# Patient Record
Sex: Male | Born: 1975 | Race: White | Hispanic: No | Marital: Married | Smoking: Never smoker
Health system: Southern US, Community
[De-identification: ages and names within clinical notes are randomized; demographics above are authoritative.]

## PROBLEM LIST (undated history)

## (undated) DIAGNOSIS — K5792 Diverticulitis of intestine, part unspecified, without perforation or abscess without bleeding: Secondary | ICD-10-CM

## (undated) DIAGNOSIS — Z87442 Personal history of urinary calculi: Secondary | ICD-10-CM

## (undated) DIAGNOSIS — Z789 Other specified health status: Secondary | ICD-10-CM

## (undated) DIAGNOSIS — Z83719 Family history of colon polyps, unspecified: Secondary | ICD-10-CM

## (undated) HISTORY — PX: COLON SURGERY: SHX602

## (undated) HISTORY — PX: OTHER SURGICAL HISTORY: SHX169

## (undated) HISTORY — DX: Diverticulitis of intestine, part unspecified, without perforation or abscess without bleeding: K57.92

## (undated) HISTORY — PX: WISDOM TOOTH EXTRACTION: SHX21

## (undated) HISTORY — PX: VASECTOMY: SHX75

## (undated) HISTORY — PX: KNEE ARTHROSCOPY: SUR90

## (undated) HISTORY — DX: Family history of colon polyps, unspecified: Z83.719

## (undated) HISTORY — DX: Personal history of urinary calculi: Z87.442

---
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
## 1999-08-03 ENCOUNTER — Encounter: Admission: RE | Admit: 1999-08-03 | Discharge: 1999-08-03 | Payer: Self-pay | Admitting: *Deleted

## 1999-08-03 ENCOUNTER — Encounter: Payer: Self-pay | Admitting: *Deleted

## 2008-07-02 HISTORY — PX: HARDWARE REMOVAL: SHX979

## 2008-07-02 HISTORY — PX: OTHER SURGICAL HISTORY: SHX169

## 2009-03-13 ENCOUNTER — Inpatient Hospital Stay: Payer: Self-pay | Admitting: Unknown Physician Specialty

## 2009-07-02 HISTORY — PX: HARDWARE REMOVAL: SHX979

## 2009-12-09 ENCOUNTER — Ambulatory Visit: Payer: Self-pay | Admitting: Unknown Physician Specialty

## 2011-07-03 HISTORY — PX: MEDIAL PARTIAL KNEE REPLACEMENT: SHX5965

## 2013-07-02 HISTORY — PX: KNEE ARTHROSCOPY: SUR90

## 2015-01-13 ENCOUNTER — Encounter: Payer: Self-pay | Admitting: Podiatry

## 2015-01-13 ENCOUNTER — Ambulatory Visit (INDEPENDENT_AMBULATORY_CARE_PROVIDER_SITE_OTHER): Payer: BLUE CROSS/BLUE SHIELD | Admitting: Podiatry

## 2015-01-13 ENCOUNTER — Ambulatory Visit (INDEPENDENT_AMBULATORY_CARE_PROVIDER_SITE_OTHER): Payer: BLUE CROSS/BLUE SHIELD

## 2015-01-13 VITALS — BP 116/86 | HR 74 | Resp 16 | Ht 72.0 in | Wt 210.0 lb

## 2015-01-13 DIAGNOSIS — M79672 Pain in left foot: Secondary | ICD-10-CM

## 2015-01-13 DIAGNOSIS — M779 Enthesopathy, unspecified: Secondary | ICD-10-CM

## 2015-01-13 MED ORDER — MELOXICAM 15 MG PO TABS: 15.0000 mg | ORAL_TABLET | Freq: Every day | ORAL | Status: DC

## 2015-01-13 NOTE — Patient Instructions (Signed)
Peroneal Tendinitis with Rehab Tendonitis is inflammation of a tendon. Inflammation of the tendons on the back of the outer ankle (peroneal tendons) is known as peroneal tendonitis. The peroneal tendons are responsible for connecting the muscles that allow you to stand on your tiptoes to the bones of the ankle. For this reason, peroneal tendonitis often causes pain when trying to complete such motions. Peroneal tendonitis often involves a tear (strain) of the peroneal tendons. Strains are classified into three categories. Grade 1 strains cause pain, but the tendon is not lengthened. Grade 2 strains include a lengthened ligament, due to the ligament being stretched or partially ruptured. With grade 2 strains there is still function, although function may be decreased. Grade 3 strains involve a complete tear of the tendon or muscle, and function is usually impaired. SYMPTOMS   Pain, tenderness, swelling, warmth, or redness over the back of the outer side of the ankle, the outer part of the mid-foot, or the bottom of the arch.  Pain that gets worse with ankle motion (especially when pushing off or pushing down with the front of the foot), or when standing on the ball of the foot or pushing the foot outward.  Crackling sound (crepitation) when the tendon is moved or touched. CAUSES  Peroneal tendinitis occurs when injury to the peroneal tendons causes the body to respond with inflammation. Common causes of injury include:  An overuse injury, in which the groove behind the outer ankle (where the tendon is located) causes wear on the tendon.  A sudden stress placed on the tendon, such as from an increase in the intensity, frequency, or duration of training.  Direct hit (trauma) to the tendon.  Return to activity too soon after a previous ankle injury. RISK INCREASES WITH:  Sports that require sudden, repetitive pushing off of the foot, such as jumping or quick starts.  Kicking and running sports,  especially running down hills or long distances.  Poor strength and flexibility.  Previous injury to the foot, ankle, or leg. PREVENTION  Warm up and stretch properly before activity.  Allow for adequate recovery between workouts.  Maintain physical fitness:  Strength, flexibility, and endurance.  Cardiovascular fitness.  Complete rehabilitation after previous injury. PROGNOSIS  If treated properly, peroneal tendonitis usually heals within 6 weeks.  RELATED COMPLICATIONS  Longer healing time, if not properly treated or if not given enough time to heal.  Recurring symptoms if activity is resumed too soon, with overuse, or when using poor technique.  If untreated, tendinitis may result in tendon rupture, requiring surgery. TREATMENT  Treatment first involves the use of ice and medicine to reduce pain and inflammation. The use of strengthening and stretching exercises may help reduce pain with activity. These exercises may be performed at home or with a therapist. Sometimes, the foot and ankle will be restrained for 10 to 14 days to promote healing. Your caregiver may advise that you place a heel lift in your shoes to reduce the stress placed on the tendon. If nonsurgical treatment is unsuccessful, surgery to remove the inflamed tendon lining (sheath) may be advised.  MEDICATION   If pain medicine is needed, nonsteroidal anti-inflammatory medicines (aspirin and ibuprofen), or other minor pain relievers (acetaminophen), are often advised.  Do not take pain medicine for 7 days before surgery.  Prescription pain relievers may be given, if your caregiver thinks they are needed. Use only as directed and only as much as you need. HEAT AND COLD  Cold treatment (icing) should   be applied for 10 to 15 minutes every 2 to 3 hours for inflammation and pain, and immediately after activity that aggravates your symptoms. Use ice packs or an ice massage.  Heat treatment may be used before  performing stretching and strengthening activities prescribed by your caregiver, physical therapist, or athletic trainer. Use a heat pack or a warm water soak. SEEK MEDICAL CARE IF:  Symptoms get worse or do not improve in 2 to 4 weeks, despite treatment.  New, unexplained symptoms develop. (Drugs used in treatment may produce side effects.) EXERCISES RANGE OF MOTION (ROM) AND STRETCHING EXERCISES - Peroneal Tendinitis These exercises may help you when beginning to rehabilitate your injury. Your symptoms may resolve with or without further involvement from your physician, physical therapist or athletic trainer. While completing these exercises, remember:   Restoring tissue flexibility helps normal motion to return to the joints. This allows healthier, less painful movement and activity.  An effective stretch should be held for at least 30 seconds.  A stretch should never be painful. You should only feel a gentle lengthening or release in the stretched tissue. RANGE OF MOTION - Ankle Eversion  Sit with your right / left ankle crossed over your opposite knee.  Grip your foot with your opposite hand, placing your thumb on the top of your foot and your fingers across the bottom of your foot.  Gently push your foot downward with a slight rotation, so your littlest toes rise slightly toward the ceiling.  You should feel a gentle stretch on the inside of your ankle. Hold the stretch for __________ seconds. Repeat __________ times. Complete this exercise __________ times per day.  RANGE OF MOTION - Ankle Inversion  Sit with your right / left ankle crossed over your opposite knee.  Grip your foot with your opposite hand, placing your thumb on the bottom of your foot and your fingers across the top of your foot.  Gently pull your foot so the smallest toe comes toward you and your thumb pushes the inside of the ball of your foot away from you.  You should feel a gentle stretch on the outside of  your ankle. Hold the stretch for __________ seconds. Repeat __________ times. Complete this exercise __________ times per day.  RANGE OF MOTION - Ankle Plantar Flexion  Sit with your right / left leg crossed over your opposite knee.  Use your opposite hand to pull the top of your foot and toes toward you.  You should feel a gentle stretch on the top of your foot and ankle. Hold this position for __________ seconds. Repeat __________ times. Complete __________ times per day.  STRETCH - Gastroc, Standing  Place your hands on a wall.  Extend your right / left leg behind you, keeping the front knee somewhat bent.  Slightly point your toes inward on your back foot.  Keeping your right / left heel on the floor and your knee straight, shift your weight toward the wall, not allowing your back to arch.  You should feel a gentle stretch in the calf. Hold this position for __________ seconds. Repeat __________ times. Complete this stretch __________ times per day. STRETCH - Soleus, Standing  Place your hands on a wall.  Extend your right / left leg behind you, keeping the other knee somewhat bent.  Slightly point your toes inward on your back foot.  Keep your heel on the floor, bend your back knee, and slightly shift your weight over the back leg so that   you feel a gentle stretch deep in your back calf.  Hold this position for __________ seconds. Repeat __________ times. Complete this stretch __________ times per day. STRETCH - Gastrocsoleus, Standing Note: This exercise can place a lot of stress on your foot and ankle. Please complete this exercise only if specifically instructed by your caregiver.   Place the ball of your right / left foot on a step, keeping your other foot firmly on the same step.  Hold on to the wall or a rail for balance.  Slowly lift your other foot, allowing your body weight to press your heel down over the edge of the step.  You should feel a stretch in your  right / left calf.  Hold this position for __________ seconds.  Repeat this exercise with a slight bend in your knee. Repeat __________ times. Complete this stretch __________ times per day.  STRENGTHENING EXERCISES - Peroneal Tendinitis  These exercises may help you when beginning to rehabilitate your injury. They may resolve your symptoms with or without further involvement from your physician, physical therapist or athletic trainer. While completing these exercises, remember:   Muscles can gain both the endurance and the strength needed for everyday activities through controlled exercises.  Complete these exercises as instructed by your physician, physical therapist or athletic trainer. Increase the resistance and repetitions only as guided by your caregiver. STRENGTH - Dorsiflexors  Secure a rubber exercise band or tubing to a fixed object (table, pole) and loop the other end around your right / left foot.  Sit on the floor facing the fixed object. The band should be slightly tense when your foot is relaxed.  Slowly draw your foot back toward you, using your ankle and toes.  Hold this position for __________ seconds. Slowly release the tension in the band and return your foot to the starting position. Repeat __________ times. Complete this exercise __________ times per day.  STRENGTH - Towel Curls  Sit in a chair, on a non-carpeted surface.  Place your foot on a towel, keeping your heel on the floor.  Pull the towel toward your heel only by curling your toes. Keep your heel on the floor.  If instructed by your physician, physical therapist or athletic trainer, add weight to the end of the towel. Repeat __________ times. Complete this exercise __________ times per day. STRENGTH - Ankle Eversion   Secure one end of a rubber exercise band or tubing to a fixed object (table, pole). Loop the other end around your foot, just before your toes.  Place your fists between your knees.  This will focus your strengthening at your ankle.  Drawing the band across your opposite foot, away from the pole, slowly, pull your little toe out and up. Make sure the band is positioned to resist the entire motion.  Hold this position for __________ seconds.  Have your muscles resist the band, as it slowly pulls your foot back to the starting position. Repeat __________ times. Complete this exercise __________ times per day.  Document Released: 06/18/2005 Document Revised: 11/02/2013 Document Reviewed: 09/30/2008 [employer] Patient Information 2015 [employer]. This information is not intended to replace advice given to you by your health care provider. Make sure you discuss any questions you have with your health care provider.  

## 2015-01-13 NOTE — Progress Notes (Signed)
   Subjective:    Patient ID: Jeremy Owens, male    DOB: Mar 03, 1976, 39 y.o.   MRN: [id number]  HPI 39 year old male presents the office they with concerns of pain to the outside aspect of his left foot. He states he has had pain to the area over the last several weeks. He states the couple weeks ago the pain was much more significant and it has pretty much resolved at this point. All the pain has been much resolved to decide to have it checked out. He denies any history of injury or trauma to the area. He states that a week after the pain started to the beach and walking on the sand mated area much worse. He said no prior treatment. No other complaints at this time.   Review of Systems  All other systems reviewed and are negative.      Objective:   Physical Exam AAO x3, NAD DP/PT pulses palpable bilaterally, CRT less than 3 seconds Protective sensation intact with Simms Weinstein monofilament, vibratory sensation intact, Achilles tendon reflex intact There is mild tenderness to palpation along the course of the peroneal tendons inferior to the lateral malleolus and just proximal to the fifth metatarsal base. There is mild discomfort with eversion. Peroneal tendons appear to be intact. There is also mild discomfort on the plantar lateral aspect of the heel just distal to the tuberosity of the calcaneus. There is no pain with lateral compression of the calcaneus and there is no pain with vibratory sensation. No pain along the posterior aspect of the Achilles tendon or along the insertion.  No areas of tenderness to bilateral lower extremities. MMT 5/5, ROM WNL.  No open lesions or pre-ulcerative lesions.  No overlying edema, erythema, increase in warmth to bilateral lower extremities.  No pain with calf compression, swelling, warmth, erythema bilaterally.      Assessment & Plan:  39 year old male with resolving left foot pain, likely peroneal tendinitis -X-rays were obtained and reviewed  with the patient.  -Treatment options discussed including all alternatives, risks, and complications -Discussed likely etiology of his symptoms. -Prescribed mobic. Discussed side effects of the medication and directed to stop if any are to occur and call the office.  -Discussed stretching, rehabilitation exercises for peroneal tendinitis. -Follow-up in 4 weeks if symptoms are not resolved or sooner if any problems arise. In the meantime, encouraged to call the office with any questions, concerns, change in symptoms.   Ovid CurdMatthew Marva Hendryx, DPM

## 2015-01-14 ENCOUNTER — Encounter: Payer: Self-pay | Admitting: Podiatry

## 2015-03-08 ENCOUNTER — Ambulatory Visit: Payer: BLUE CROSS/BLUE SHIELD | Admitting: Podiatry

## 2015-07-03 HISTORY — PX: KNEE ARTHROSCOPY: SHX127

## 2015-09-11 ENCOUNTER — Emergency Department (HOSPITAL_COMMUNITY): Payer: BLUE CROSS/BLUE SHIELD

## 2015-09-11 ENCOUNTER — Encounter (HOSPITAL_COMMUNITY): Payer: Self-pay | Admitting: Family Medicine

## 2015-09-11 ENCOUNTER — Emergency Department (HOSPITAL_COMMUNITY)
Admission: EM | Admit: 2015-09-11 | Discharge: 2015-09-11 | Disposition: A | Discharge status: Home / Self Care | Payer: BLUE CROSS/BLUE SHIELD | Attending: Emergency Medicine | Admitting: Emergency Medicine

## 2015-09-11 DIAGNOSIS — W208XXA Other cause of strike by thrown, projected or falling object, initial encounter: Secondary | ICD-10-CM | POA: Insufficient documentation

## 2015-09-11 DIAGNOSIS — S61219A Laceration without foreign body of unspecified finger without damage to nail, initial encounter: Secondary | ICD-10-CM

## 2015-09-11 DIAGNOSIS — Z23 Encounter for immunization: Secondary | ICD-10-CM | POA: Diagnosis not present

## 2015-09-11 DIAGNOSIS — S61011A Laceration without foreign body of right thumb without damage to nail, initial encounter: Secondary | ICD-10-CM | POA: Diagnosis not present

## 2015-09-11 DIAGNOSIS — Y9289 Other specified places as the place of occurrence of the external cause: Secondary | ICD-10-CM | POA: Insufficient documentation

## 2015-09-11 DIAGNOSIS — Y9389 Activity, other specified: Secondary | ICD-10-CM | POA: Diagnosis not present

## 2015-09-11 DIAGNOSIS — Z791 Long term (current) use of non-steroidal anti-inflammatories (NSAID): Secondary | ICD-10-CM | POA: Insufficient documentation

## 2015-09-11 DIAGNOSIS — Y998 Other external cause status: Secondary | ICD-10-CM | POA: Diagnosis not present

## 2015-09-11 DIAGNOSIS — S6991XA Unspecified injury of right wrist, hand and finger(s), initial encounter: Secondary | ICD-10-CM | POA: Diagnosis present

## 2015-09-11 MED ORDER — OXYCODONE-ACETAMINOPHEN 5-325 MG PO TABS
1.0000 | ORAL_TABLET | Freq: Once | ORAL | Status: AC
  Administered 2015-09-11: 1 via ORAL
  Filled 2015-09-11: qty 1

## 2015-09-11 MED ORDER — LIDOCAINE HCL (PF) 1 % IJ SOLN
5.0000 mL | Freq: Once | INTRAMUSCULAR | Status: AC
  Administered 2015-09-11: 5 mL
  Filled 2015-09-11: qty 5

## 2015-09-11 MED ORDER — TETANUS-DIPHTH-ACELL PERTUSSIS 5-2.5-18.5 LF-MCG/0.5 IM SUSP
0.5000 mL | Freq: Once | INTRAMUSCULAR | Status: AC
  Administered 2015-09-11: 0.5 mL via INTRAMUSCULAR
  Filled 2015-09-11: qty 0.5

## 2015-09-11 NOTE — ED Notes (Signed)
Pt here for laceration to right thumb with a table saw. Bleeding controlled. sts sensation in tact.

## 2015-09-11 NOTE — ED Notes (Signed)
Declined W/C at D/C and was escorted to lobby by RN. 

## 2015-09-11 NOTE — Discharge Instructions (Signed)
Laceration Care, Adult  A laceration is a cut that goes through all layers of the skin. The cut also goes into the tissue that is right under the skin. Some cuts heal on their own. Others need to be closed with stitches (sutures), staples, skin adhesive strips, or wound glue. Taking care of your cut lowers your risk of infection and helps your cut to heal better.  HOW TO TAKE CARE OF YOUR CUT  For stitches or staples:  · Keep the wound clean and dry.  · If you were given a bandage (dressing), you should change it at least one time per day or as told by your doctor. You should also change it if it gets wet or dirty.  · Keep the wound completely dry for the first 24 hours or as told by your doctor. After that time, you may take a shower or a bath. However, make sure that the wound is not soaked in water until after the stitches or staples have been removed.  · Clean the wound one time each day or as told by your doctor:    Wash the wound with soap and water.    Rinse the wound with water until all of the soap comes off.    Pat the wound dry with a clean towel. Do not rub the wound.  · After you clean the wound, put a thin layer of antibiotic ointment on it as told by your doctor. This ointment:    Helps to prevent infection.    Keeps the bandage from sticking to the wound.  · Have your stitches or staples removed as told by your doctor.  If your doctor used skin adhesive strips:   · Keep the wound clean and dry.  · If you were given a bandage, you should change it at least one time per day or as told by your doctor. You should also change it if it gets dirty or wet.  · Do not get the skin adhesive strips wet. You can take a shower or a bath, but be careful to keep the wound dry.  · If the wound gets wet, pat it dry with a clean towel. Do not rub the wound.  · Skin adhesive strips fall off on their own. You can trim the strips as the wound heals. Do not remove any strips that are still stuck to the wound. They will  fall off after a while.  If your doctor used wound glue:  · Try to keep your wound dry, but you may briefly wet it in the shower or bath. Do not soak the wound in water, such as by swimming.  · After you take a shower or a bath, gently pat the wound dry with a clean towel. Do not rub the wound.  · Do not do any activities that will make you really sweaty until the skin glue has fallen off on its own.  · Do not apply liquid, cream, or ointment medicine to your wound while the skin glue is still on.  · If you were given a bandage, you should change it at least one time per day or as told by your doctor. You should also change it if it gets dirty or wet.  · If a bandage is placed over the wound, do not let the tape for the bandage touch the skin glue.  · Do not pick at the glue. The skin glue usually stays on for 5-10 days. Then, it   or when wound glue stays in place and the wound is healed. Make sure to wear a sunscreen of at least 30 SPF.  Take over-the-counter and prescription medicines only as told by your doctor.  If you were given antibiotic medicine or ointment, take or apply it as told by your doctor. Do not stop using the antibiotic even if your wound is getting better.  Do not scratch or pick at the wound.  Keep all follow-up visits as told by your doctor. This is important.  Check your wound every day for signs of infection. Watch for:  Redness, swelling, or pain.  Fluid, blood, or pus.  Raise (elevate) the injured area above the level of your heart while you are sitting or lying down, if possible. GET HELP IF:  You got a tetanus shot and you have any of these problems at the injection site:  Swelling.  Very bad pain.  Redness.  Bleeding.  You have a fever.  A wound that was  closed breaks open.  You notice a bad smell coming from your wound or your bandage.  You notice something coming out of the wound, such as wood or glass.  Medicine does not help your pain.  You have more redness, swelling, or pain at the site of your wound.  You have fluid, blood, or pus coming from your wound.  You notice a change in the color of your skin near your wound.  You need to change the bandage often because fluid, blood, or pus is coming from the wound.  You start to have a new rash.  You start to have numbness around the wound. GET HELP RIGHT AWAY IF:  You have very bad swelling around the wound.  Your pain suddenly gets worse and is very bad.  You notice painful lumps near the wound or on skin that is anywhere on your body.  You have a red streak going away from your wound.  The wound is on your hand or foot and you cannot move a finger or toe like you usually can.  The wound is on your hand or foot and you notice that your fingers or toes look pale or bluish.   This information is not intended to replace advice given to you by your health care provider. Make sure you discuss any questions you have with your health care provider.  Follow-up with your PCP for suture removal in 7 days. Keep wound clean and dry. May wash as up and water. Take Profen as needed for pain. Return to the emergency department if you expands worsening of her symptoms, redness or swelling around your finger, fever, chills.

## 2015-09-11 NOTE — ED Provider Notes (Signed)
CSN: [id number]     Arrival date & time 09/11/15  1408 History   First MD Initiated Contact with Patient 09/11/15 1506     Chief Complaint  Patient presents with  . Finger Injury     (Consider location/radiation/quality/duration/timing/severity/associated sxs/prior Treatment) HPI  Jeremy Owens is a 40 y.o M With no significant past medical history presents the emergency department complaining of a laceration to his right thumb. Patient states that he was using a table saw earlier when it slipped and it cut the finger pad of his right thumb. Bleeding has subsided. Patient unknown of last tetanus. No numbness or tingling. No decreased range of motion of thumb.   History reviewed. No pertinent past medical history. History reviewed. No pertinent past surgical history. History reviewed. No pertinent family history. Social History  Substance Use Topics  . Smoking status: Never Smoker   . Smokeless tobacco: Current User    Types: Chew  . Alcohol Use: None    Review of Systems  All other systems reviewed and are negative.     Allergies  Sulfa antibiotics  Home Medications   Prior to Admission medications   Medication Sig Start Date End Date Taking? Authorizing Provider  meloxicam (MOBIC) 15 MG tablet Take 1 tablet (15 mg total) by mouth daily. 01/13/15   Vivi Barrack, DPM  traMADol (ULTRAM) 50 MG tablet Take by mouth every 6 (six) hours as needed. for pain 12/27/14   Historical Provider, MD   BP 117/69 mmHg  Pulse 64  Temp(Src) 98.3 F (36.8 C) (Oral)  Resp 18  SpO2 97% Physical Exam  Constitutional: He is oriented to person, place, and time. He appears well-developed and well-nourished. No distress.  HENT:  Head: Normocephalic and atraumatic.  Eyes: Conjunctivae are normal. Right eye exhibits no discharge. Left eye exhibits no discharge. No scleral icterus.  Cardiovascular: Normal rate.   Pulmonary/Chest: Effort normal.  Musculoskeletal:       Right hand: He  exhibits decreased range of motion and laceration. He exhibits no bony tenderness, normal capillary refill and no swelling. Normal sensation noted. Normal strength noted.       Hands: 1.5 cm laceration to finger pad of right thumb. No foreign body seen. No sensory deficits. No evidence of tendon injury. No decreased range of motion of thumb  Neurological: He is alert and oriented to person, place, and time. Coordination normal.  Skin: Skin is warm and dry. No rash noted. He is not diaphoretic. No erythema. No pallor.  Psychiatric: He has a normal mood and affect. His behavior is normal.  Nursing note and vitals reviewed.   ED Course  Procedures (including critical care time) LACERATION REPAIR Performed by: Dub Mikes Authorized by: Dub Mikes Consent: Verbal consent obtained. Risks and benefits: risks, benefits and alternatives were discussed Consent given by: patient Patient identity confirmed: provided demographic data Prepped and Draped in normal sterile fashion Wound explored  Laceration Location: Right thumb  Laceration Length: 1.5 cm  No Foreign Bodies seen or palpated  Anesthesia: local infiltration  Local anesthetic: lidocaine 1 % without epinephrine  Anesthetic total: 2.5 ml  Irrigation method: syringe Amount of cleaning: standard  Skin closure: Approximated   Number of sutures: 3   Technique: Simple interrupted   Patient tolerance: Patient tolerated the procedure well with no immediate complications.  Labs Review Labs Reviewed - No data to display  Imaging Review Dg Finger Thumb Right  09/11/2015  CLINICAL DATA:  Laceration to distal phalanx  on table saw. EXAM: RIGHT THUMB 2+V COMPARISON:  None. FINDINGS: There is no evidence of fracture or dislocation. There is no evidence of arthropathy or other focal bone abnormality. Soft tissues are unremarkable IMPRESSION: Negative. Electronically Signed   By: Charlett NoseKevin  Dover M.D.   On: 09/11/2015  15:43   I have personally reviewed and evaluated these images and lab results as part of my medical decision-making.   EKG Interpretation None      MDM   Final diagnoses:  Finger laceration, initial encounter    Tdap booster given.Pressure irrigation performed. Laceration occurred < 8 hours prior to repair which was well tolerated. Pt has no co morbidities to effect normal wound healing. X-ray negative for fracture. Discussed suture home care w pt and answered questions. Pt to f-u for wound check and suture removal in 7 days. Pt is hemodynamically stable w no complaints prior to dc.       Lester KinsmanSamantha Tripp [place], PA-C 09/11/15 1754  Doug SouSam Jacubowitz, MD 09/11/15 1815

## 2015-11-23 ENCOUNTER — Other Ambulatory Visit: Payer: Self-pay | Admitting: Orthopedic Surgery

## 2015-11-23 DIAGNOSIS — M25561 Pain in right knee: Secondary | ICD-10-CM

## 2015-12-03 ENCOUNTER — Ambulatory Visit
Admission: RE | Admit: 2015-12-03 | Discharge: 2015-12-03 | Disposition: A | Discharge status: Home / Self Care | Payer: BLUE CROSS/BLUE SHIELD | Source: Ambulatory Visit | Attending: Orthopedic Surgery | Admitting: Orthopedic Surgery

## 2015-12-03 DIAGNOSIS — M25561 Pain in right knee: Secondary | ICD-10-CM

## 2016-01-11 DIAGNOSIS — M659 Synovitis and tenosynovitis, unspecified: Secondary | ICD-10-CM | POA: Insufficient documentation

## 2016-01-11 DIAGNOSIS — Z96659 Presence of unspecified artificial knee joint: Secondary | ICD-10-CM | POA: Insufficient documentation

## 2017-03-07 ENCOUNTER — Ambulatory Visit: Payer: BLUE CROSS/BLUE SHIELD | Admitting: Adult Health

## 2017-03-07 ENCOUNTER — Encounter: Payer: Self-pay | Admitting: Adult Health

## 2017-03-07 ENCOUNTER — Ambulatory Visit
Admission: RE | Admit: 2017-03-07 | Discharge: 2017-03-07 | Disposition: A | Discharge status: Home / Self Care | Payer: BLUE CROSS/BLUE SHIELD | Source: Ambulatory Visit | Attending: Adult Health | Admitting: Adult Health

## 2017-03-07 VITALS — BP 130/86 | HR 75 | Temp 97.7°F | Wt 223.8 lb

## 2017-03-07 DIAGNOSIS — Z9889 Other specified postprocedural states: Secondary | ICD-10-CM

## 2017-03-07 DIAGNOSIS — M179 Osteoarthritis of knee, unspecified: Secondary | ICD-10-CM | POA: Insufficient documentation

## 2017-03-07 DIAGNOSIS — R202 Paresthesia of skin: Secondary | ICD-10-CM | POA: Insufficient documentation

## 2017-03-07 DIAGNOSIS — S8991XA Unspecified injury of right lower leg, initial encounter: Secondary | ICD-10-CM | POA: Insufficient documentation

## 2017-03-07 DIAGNOSIS — M1710 Unilateral primary osteoarthritis, unspecified knee: Secondary | ICD-10-CM | POA: Insufficient documentation

## 2017-03-07 NOTE — Patient Instructions (Signed)
Paresthesia Paresthesia is a burning or prickling feeling. This feeling can happen in any part of the body. It often happens in the hands, arms, legs, or feet. Usually, it is not painful. In most cases, the feeling goes away in a short time and is not a sign of a serious problem. Follow these instructions at home:  Avoid drinking alcohol.  Try massage or needle therapy (acupuncture) to help with your problems.  Keep all follow-up visits as told by your doctor. This is important. Contact a doctor if:  You keep on having episodes of paresthesia.  Your burning or prickling feeling gets worse when you walk.  You have pain or cramps.  You feel dizzy.  You have a rash. Get help right away if:  You feel weak.  You have trouble walking or moving.  You have problems speaking, understanding, or seeing.  You feel confused.  You cannot control when you pee (urinate) or poop (bowel movement).  You lose feeling (numbness) after an injury.  You pass out (faint). This information is not intended to replace advice given to you by your health care provider. Make sure you discuss any questions you have with your health care provider. Document Released: 05/31/2008 Document Revised: 11/24/2015 Document Reviewed: 06/14/2014 Elsevier Interactive Patient Education  2018 [employer] Inc[employer]  

## 2017-03-07 NOTE — Progress Notes (Signed)
Subjective:     Patient ID: Jeremy Owens, male   DOB: Aug 13, 1975, 41 y.o.   MRN: [id number]  HPI Right elbow " aggravation" - he feels his  " pinky" feels numb all the time. He reports other fingers go numb with  Activity such as working on cars etc.  He reports he has to stop the activity shake his hands to get relief. He then report she is able to go back to the activity.  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 He denies any symptoms in his right arm/ hand.    He denies any repetitive motions with his right arm.  He has had paresthesias in his right fingers and hands intermittently usually with activities.  He had a baseball injury and reports Dr. Ollen Bowl operated on his right elbow in 1996 as it was dislocated and this MD is no longer practicing.- orthopedics.   He is being treated for workers comp for a right knee injury resulting from wrestling a kid on the job.  He denies any known injury to his arm or any other area of his body. He denies any neck or back pain. He denies any other pain at this time. He denies any wrist pain.  History of dislocation of his right elbow and had surgery. 1996 Vitals:   03/07/17 0907  BP: 130/86  Pulse: 75  Temp: 97.7 F (36.5 C)   Patient Active Problem List   Diagnosis Date Noted  . Right knee injury   . H/O elbow surgery 03/08/1995   No current outpatient prescriptions on file.- he reports he is taking Ibuprofen occasionally.   Review of Systems  Constitutional: Negative.   HENT: Negative.   Eyes: Negative.   Respiratory: Negative.   Cardiovascular: Negative.   Gastrointestinal: Negative.   Endocrine: Negative.   Genitourinary: Negative.   Musculoskeletal: Positive for arthralgias (right elbow today's visit - he feels his right elbow is irritated  ( also right knee pain being treated- he denies any pain at  this time ). Negative for back pain, gait problem, joint swelling, myalgias, neck pain and neck stiffness.  Skin: Negative.   Neurological: Positive for numbness (" right pinky" is numb most of the time per patient/ he notices tingling in his right medial arm at elbow and in is right fingers intermittently as well.- he denies any temperature or color change to skin.). Negative for dizziness, tremors, seizures, syncope, facial asymmetry, speech difficulty, weakness, light-headedness and headaches.  Hematological: Negative.   Psychiatric/Behavioral: Negative.        Objective:   Physical Exam  Constitutional: He is oriented to person, place, and time. He appears well-developed and well-nourished. He is active.  Non-toxic appearance. He does not have a sickly appearance. He does not appear ill. No distress.  HENT:  Head: Normocephalic and atraumatic.  Right Ear: Hearing, tympanic membrane, external ear and ear canal normal.  Left Ear: Hearing, tympanic membrane, external ear and ear canal normal.  Nose: Nose normal.  Mouth/Throat: Uvula is midline, oropharynx is clear and moist and mucous membranes are normal. No oropharyngeal exudate.  Eyes: Pupils are equal, round, and reactive to light. Conjunctivae, EOM and lids are normal. Right eye exhibits no discharge. Left eye exhibits no discharge. No scleral icterus.  Neck: Trachea normal, normal range of motion, full passive range of  motion without pain and phonation normal. Neck supple. Normal carotid pulses, no hepatojugular reflux and no JVD present. No tracheal tenderness present. Carotid bruit is not present. No tracheal deviation present. No Brudzinski's sign noted. No thyromegaly present.  Cardiovascular: Normal rate, regular rhythm, S1 normal, S2 normal, normal heart sounds, intact distal pulses and normal pulses.  Exam reveals no gallop, no distant heart sounds and no friction rub.   No murmur heard. Pulmonary/Chest: Effort normal and breath  sounds normal. No accessory muscle usage or stridor. No respiratory distress. He has no wheezes. He has no rales. He exhibits no tenderness.  Abdominal: Soft. Normal appearance, normal aorta and bowel sounds are normal. He exhibits no distension and no mass. There is no tenderness. There is no rebound and no guarding.  Musculoskeletal: He exhibits no edema or deformity.       Right shoulder: Normal.       Left shoulder: Normal.       Right elbow: He exhibits normal range of motion and no swelling. Tenderness (mild with palpation medial side ) found.       Left elbow: Normal.       Right wrist: Normal.       Left wrist: Normal.       Right hip: Normal.       Left hip: Normal.       Cervical back: Normal. He exhibits normal range of motion, no tenderness, no bony tenderness, no swelling, no edema, no deformity, no laceration, no pain, no spasm and normal pulse.       Thoracic back: He exhibits normal range of motion, no tenderness, no bony tenderness, no swelling, no edema, no deformity, no laceration, no pain, no spasm and normal pulse.       Lumbar back: Normal.       Right upper arm: Normal.       Left upper arm: Normal.       Right forearm: Normal.       Left forearm: Normal.       Right hand: He exhibits normal range of motion, no tenderness, no bony tenderness, normal two-point discrimination, normal capillary refill, no deformity, no laceration and no swelling. Normal sensation noted. Normal strength noted.       Left hand: Normal.  Patient moves on and off exam table without difficulty. Gait is sure and steady in room and in hall.  Tinel test negative right. Phalens test negative right and left  2 + right radial pulse/ 2 + radial brachialis  pulse   Lymphadenopathy:       Head (right side): No submental, no submandibular, no tonsillar, no preauricular, no posterior auricular and no occipital adenopathy present.       Head (left side): No submental, no submandibular, no tonsillar, no  preauricular, no posterior auricular and no occipital adenopathy present.    He has no cervical adenopathy.    He has no axillary adenopathy.  Neurological: He is alert and oriented to person, place, and time. He has normal strength and normal reflexes. No cranial nerve deficit or sensory deficit. He exhibits normal muscle tone. He displays a negative Romberg sign. Coordination normal. GCS eye subscore is 4. GCS verbal subscore is 5. GCS motor subscore is 6.  Reflex Scores:      Tricep reflexes are 2+ on the right side and 2+ on the left side.      Bicep reflexes are 2+ on the right side and 2+ on the left  side.      Brachioradialis reflexes are 2+ on the right side and 2+ on the left side. Skin: Skin is warm, dry and intact. No rash noted. He is not diaphoretic. No cyanosis or erythema. No pallor. Nails show no clubbing.  Psychiatric: He has a normal mood and affect. His speech is normal and behavior is normal. Judgment and thought content normal. Cognition and memory are normal.  Vitals reviewed.      Assessment:  Paresthesia of thumb of right hand - Plan: DG Cervical Spine Complete, DG ELBOW COMPLETE RIGHT (3+VIEW), DG Hand Complete Right  H/O elbow surgery      Plan:     1. Will x ray and call with results.  Will refer to orthopedics for further evaluation of above symptoms.   Return to clinic at any time  if any new symptoms change, worsen or do not improve. Symptoms not worsen  and if not improving you should call for an appointment at the clinic or bee seen in urgent care/ED if clinic is closed.  Patient verbalized above understanding of all instructions.  Patient verbalized understanding of instructions and denies any further questions at this time.  Orders Placed This Encounter  Procedures  . DG Cervical Spine Complete    Standing Status:   Future    Number of Occurrences:   1    Standing Expiration Date:   05/07/2018    Order Specific Question:   Reason for Exam (SYMPTOM   OR DIAGNOSIS REQUIRED)    Answer:   paresthesia right arm/ hand    Order Specific Question:   Preferred imaging location?    Answer:   [facility]    Order Specific Question:   Radiology Contrast Protocol - do NOT remove file path    Answer:   \\[email or web address]\Radiant\DXFluoroContrastProtocols.pdf  . DG ELBOW COMPLETE RIGHT (3+VIEW)    Standing Status:   Future    Number of Occurrences:   1    Standing Expiration Date:   05/07/2018    Order Specific Question:   Reason for Exam (SYMPTOM  OR DIAGNOSIS REQUIRED)    Answer:   paresthesia right hand/ 1st digit numbness    Order Specific Question:   Preferred imaging location?    Answer:   [facility]    Order Specific Question:   Radiology Contrast Protocol - do NOT remove file path    Answer:   \\[email or web address]\[email or web address]s.pdf  . DG Hand Complete Right    Standing Status:   Future    Number of Occurrences:   1    Standing Expiration Date:   05/07/2018    Order Specific Question:   Reason for Exam (SYMPTOM  OR DIAGNOSIS REQUIRED)    Answer:   presthesia rigt hand    Order Specific Question:   Preferred imaging location?    Answer[facility]    [facility]    Order Specific Question:   Radiology Contrast Protocol - do NOT remove file path    Answer:   \\[email or web address]\[facility]\DXFluoroContrastProtocols.pdf

## 2017-03-14 NOTE — Addendum Note (Signed)
Addended by: Billie LadeNOVAK, CYNTHIA A on: 03/14/2017 10:33 AM   Modules accepted: Orders

## 2017-05-13 NOTE — H&P (Signed)
  Jeremy Owens is an 41 y.o. male.   Chief Complaint:  RIGHT CUBITAL TUNNEL SYNDROME  HPI: Jeremy Owens IS A 41 Y/O RIGHT HAND DOMINANT MALE WHO HAS NUMBNESS AND TINGLING IN THE RING AND SMALL FINGERS THAT HAS BEEN PRESENT FOR SEVERAL YEARS. HE HAD AN ULNAR NERVE RELEASE WITH PROBABLE TRANSPOSITION ABOUT 20 YEARS AGO WITH THE SYMPTOMS PERSISTING.  HE HAS HAD RECENT NERVE CONDUCTION STUDIES AND HAS UNDERGONE CONSERVATIVE TREATMENT.  DISCUSSED THE REASON AND RATIONALE FOR SURGICAL INTERVENTION. REVIEWED THE SURGICAL PROCEDURE, INCLUDING THE RISKS VERSUS BENEFITS, AND THE POST-OPERATIVE RECOVERY PROCESS.  THE PATIENT IS HERE TODAY FOR SURGERY.   No past medical history on file.  No past surgical history on file.  No family history on file. Social History:  reports that  has never smoked. His smokeless tobacco use includes chew. His alcohol and drug histories are not on file.  Allergies:  Allergies  Allergen Reactions  . Sulfa Antibiotics Diarrhea    No medications prior to admission.    No results found for this or any previous visit (from the past 48 hour(s)). No results found.  ROS NO RECENT ILLNESSES OR HOSPITALIZATIONS  There were no vitals taken for this visit. Physical Exam  General Appearance:  Alert, cooperative, no distress, appears stated age  Head:  Normocephalic, without obvious abnormality, atraumatic  Eyes:  Pupils equal, conjunctiva/corneas clear,         Throat: Lips, mucosa, and tongue normal; teeth and gums normal  Neck: No visible masses     Lungs:   respirations unlabored  Chest Wall:  No tenderness or deformity  Heart:  Regular rate and rhythm,  Abdomen:   Soft, non-tender,         Extremities:   Pulses: 2+ and symmetric  Skin: Skin color, texture, turgor normal, no rashes or lesions     Neurologic: Normal    Assessment RIGHT CUBITAL TUNNEL SYNDROME-RECURRENT  Plan RIGHT ELBOW ULNAR NERVE DECOMPRESSION AND/OR TRANSPOSITION AND NERVE  WRAPPING  R/B/A DISCUSSED WITH PT IN OFFICE.  PT VOICED UNDERSTANDING OF PLAN CONSENT SIGNED DAY OF SURGERY PT SEEN AND EXAMINED PRIOR TO OPERATIVE PROCEDURE/DAY OF SURGERY SITE MARKED. QUESTIONS ANSWERED WILL GO HOME FOLLOWING SURGERY  WE ARE PLANNING SURGERY FOR YOUR UPPER EXTREMITY. THE RISKS AND BENEFITS OF SURGERY INCLUDE BUT NOT LIMITED TO BLEEDING INFECTION, DAMAGE TO NEARBY NERVES ARTERIES TENDONS, FAILURE OF SURGERY TO ACCOMPLISH ITS INTENDED GOALS, PERSISTENT SYMPTOMS AND NEED FOR FURTHER SURGICAL INTERVENTION. WITH THIS IN MIND WE WILL PROCEED. I HAVE DISCUSSED WITH THE PATIENT THE PRE AND POSTOPERATIVE REGIMEN AND THE DOS AND DON'TS. PT VOICED UNDERSTANDING AND INFORMED CONSENT SIGNED.  Karma GreaserSamantha Bonham Barton 05/13/2017, 5:13 PM

## 2017-05-14 ENCOUNTER — Encounter (HOSPITAL_COMMUNITY): Payer: Self-pay | Admitting: *Deleted

## 2017-05-15 ENCOUNTER — Other Ambulatory Visit: Payer: Self-pay

## 2017-05-15 ENCOUNTER — Ambulatory Visit (HOSPITAL_COMMUNITY): Payer: BLUE CROSS/BLUE SHIELD | Admitting: Certified Registered Nurse Anesthetist

## 2017-05-15 ENCOUNTER — Ambulatory Visit (HOSPITAL_COMMUNITY)
Admission: RE | Admit: 2017-05-15 | Discharge: 2017-05-15 | Disposition: A | Discharge status: Home / Self Care | Payer: BLUE CROSS/BLUE SHIELD | Source: Ambulatory Visit | Attending: Orthopedic Surgery | Admitting: Orthopedic Surgery

## 2017-05-15 ENCOUNTER — Encounter (HOSPITAL_COMMUNITY)
Admission: RE | Disposition: A | Discharge status: Home / Self Care | Payer: Self-pay | Source: Ambulatory Visit | Attending: Orthopedic Surgery

## 2017-05-15 ENCOUNTER — Encounter (HOSPITAL_COMMUNITY): Payer: Self-pay

## 2017-05-15 DIAGNOSIS — F17220 Nicotine dependence, chewing tobacco, uncomplicated: Secondary | ICD-10-CM | POA: Diagnosis not present

## 2017-05-15 DIAGNOSIS — Z9889 Other specified postprocedural states: Secondary | ICD-10-CM

## 2017-05-15 DIAGNOSIS — G5621 Lesion of ulnar nerve, right upper limb: Secondary | ICD-10-CM | POA: Diagnosis present

## 2017-05-15 HISTORY — PX: ULNAR NERVE TRANSPOSITION: SHX2595

## 2017-05-15 HISTORY — DX: Other specified health status: Z78.9

## 2017-05-15 LAB — BASIC METABOLIC PANEL
Anion gap: 9 (ref 5–15)
BUN: 9 mg/dL (ref 6–20)
CALCIUM: 9.5 mg/dL (ref 8.9–10.3)
CO2: 27 mmol/L (ref 22–32)
CREATININE: 0.97 mg/dL (ref 0.61–1.24)
Chloride: 101 mmol/L (ref 101–111)
GFR calc Af Amer: >60 mL/min (ref >60)
GLUCOSE: 81 mg/dL (ref 65–99)
Potassium: 3.6 mmol/L (ref 3.5–5.1)
Sodium: 137 mmol/L (ref 135–145)

## 2017-05-15 LAB — CBC
HCT: 48 % (ref 39.0–52.0)
Hemoglobin: 17.2 g/dL — ABNORMAL HIGH (ref 13.0–17.0)
MCH: 32.1 pg (ref 26.0–34.0)
MCHC: 35.8 g/dL (ref 30.0–36.0)
MCV: 89.6 fL (ref 78.0–100.0)
PLATELETS: 192 10*3/uL (ref 150–400)
RBC: 5.36 MIL/uL (ref 4.22–5.81)
RDW: 13.6 % (ref 11.5–15.5)
WBC: 8.9 10*3/uL (ref 4.0–10.5)

## 2017-05-15 SURGERY — ULNAR NERVE DECOMPRESSION/TRANSPOSITION
Anesthesia: General | Laterality: Right

## 2017-05-15 MED ORDER — OXYCODONE HCL 5 MG/5ML PO SOLN: 5.0000 mg | Freq: Once | ORAL | Status: AC | PRN

## 2017-05-15 MED ORDER — PROPOFOL 10 MG/ML IV BOLUS
INTRAVENOUS | Status: AC
  Filled 2017-05-15: qty 20

## 2017-05-15 MED ORDER — MIDAZOLAM HCL 2 MG/2ML IJ SOLN
INTRAMUSCULAR | Status: AC
  Filled 2017-05-15: qty 2

## 2017-05-15 MED ORDER — PROPOFOL 10 MG/ML IV BOLUS
INTRAVENOUS | Status: DC | PRN
  Administered 2017-05-15: 50 mg via INTRAVENOUS
  Administered 2017-05-15: 200 mg via INTRAVENOUS

## 2017-05-15 MED ORDER — ROCURONIUM BROMIDE 100 MG/10ML IV SOLN
INTRAVENOUS | Status: DC | PRN
  Administered 2017-05-15: 60 mg via INTRAVENOUS

## 2017-05-15 MED ORDER — OXYCODONE-ACETAMINOPHEN 5-325 MG PO TABS: 1.0000 | ORAL_TABLET | Freq: Three times a day (TID) | ORAL | 0 refills | Status: AC

## 2017-05-15 MED ORDER — ROCURONIUM BROMIDE 10 MG/ML (PF) SYRINGE
PREFILLED_SYRINGE | INTRAVENOUS | Status: AC
  Filled 2017-05-15: qty 5

## 2017-05-15 MED ORDER — CEFAZOLIN SODIUM-DEXTROSE 2-4 GM/100ML-% IV SOLN
INTRAVENOUS | Status: AC
  Filled 2017-05-15: qty 100

## 2017-05-15 MED ORDER — OXYCODONE HCL 5 MG PO TABS
5.0000 mg | ORAL_TABLET | Freq: Once | ORAL | Status: AC | PRN
  Administered 2017-05-15: 5 mg via ORAL

## 2017-05-15 MED ORDER — MIDAZOLAM HCL 2 MG/2ML IJ SOLN
INTRAMUSCULAR | Status: AC
  Administered 2017-05-15 (×2): 1 mg
  Filled 2017-05-15: qty 2

## 2017-05-15 MED ORDER — SUCCINYLCHOLINE CHLORIDE 200 MG/10ML IV SOSY
PREFILLED_SYRINGE | INTRAVENOUS | Status: AC
  Filled 2017-05-15: qty 10

## 2017-05-15 MED ORDER — OXYCODONE HCL 5 MG PO TABS
ORAL_TABLET | ORAL | Status: AC
  Filled 2017-05-15: qty 1

## 2017-05-15 MED ORDER — ONDANSETRON HCL 4 MG/2ML IJ SOLN
INTRAMUSCULAR | Status: DC | PRN
  Administered 2017-05-15: 4 mg via INTRAVENOUS

## 2017-05-15 MED ORDER — FENTANYL CITRATE (PF) 250 MCG/5ML IJ SOLN
INTRAMUSCULAR | Status: AC
  Filled 2017-05-15: qty 5

## 2017-05-15 MED ORDER — CEFAZOLIN SODIUM-DEXTROSE 2-3 GM-%(50ML) IV SOLR
INTRAVENOUS | Status: DC | PRN
  Administered 2017-05-15: 2 g via INTRAVENOUS

## 2017-05-15 MED ORDER — FENTANYL CITRATE (PF) 250 MCG/5ML IJ SOLN
INTRAMUSCULAR | Status: DC | PRN
  Administered 2017-05-15: 100 ug via INTRAVENOUS
  Administered 2017-05-15: 50 ug via INTRAVENOUS
  Administered 2017-05-15: 100 ug via INTRAVENOUS

## 2017-05-15 MED ORDER — 0.9 % SODIUM CHLORIDE (POUR BTL) OPTIME
TOPICAL | Status: DC | PRN
  Administered 2017-05-15: 1000 mL

## 2017-05-15 MED ORDER — MIDAZOLAM HCL 2 MG/2ML IJ SOLN
INTRAMUSCULAR | Status: DC | PRN
  Administered 2017-05-15: 2 mg via INTRAVENOUS

## 2017-05-15 MED ORDER — PROMETHAZINE HCL 25 MG/ML IJ SOLN: 6.2500 mg | INTRAMUSCULAR | Status: DC | PRN

## 2017-05-15 MED ORDER — BUPIVACAINE HCL (PF) 0.25 % IJ SOLN
INTRAMUSCULAR | Status: DC | PRN
  Administered 2017-05-15: 10 mL

## 2017-05-15 MED ORDER — LIDOCAINE 2% (20 MG/ML) 5 ML SYRINGE
INTRAMUSCULAR | Status: AC
  Filled 2017-05-15: qty 5

## 2017-05-15 MED ORDER — CEFAZOLIN SODIUM-DEXTROSE 2-4 GM/100ML-% IV SOLN: 2.0000 g | INTRAVENOUS | Status: DC

## 2017-05-15 MED ORDER — LIDOCAINE HCL (CARDIAC) 20 MG/ML IV SOLN
INTRAVENOUS | Status: DC | PRN
  Administered 2017-05-15: 100 mg via INTRATRACHEAL

## 2017-05-15 MED ORDER — CHLORHEXIDINE GLUCONATE 4 % EX LIQD: 60.0000 mL | Freq: Once | CUTANEOUS | Status: DC

## 2017-05-15 MED ORDER — BUPIVACAINE HCL (PF) 0.25 % IJ SOLN
INTRAMUSCULAR | Status: AC
  Filled 2017-05-15: qty 10

## 2017-05-15 MED ORDER — DEXAMETHASONE SODIUM PHOSPHATE 10 MG/ML IJ SOLN
INTRAMUSCULAR | Status: DC | PRN
  Administered 2017-05-15: 10 mg via INTRAVENOUS

## 2017-05-15 MED ORDER — SUGAMMADEX SODIUM 200 MG/2ML IV SOLN
INTRAVENOUS | Status: DC | PRN
  Administered 2017-05-15: 200 mg via INTRAVENOUS

## 2017-05-15 MED ORDER — HYDROMORPHONE HCL 1 MG/ML IJ SOLN: 0.2500 mg | INTRAMUSCULAR | Status: DC | PRN

## 2017-05-15 MED ORDER — LACTATED RINGERS IV SOLN
INTRAVENOUS | Status: DC
  Administered 2017-05-15 (×2): via INTRAVENOUS

## 2017-05-15 MED ORDER — MEPERIDINE HCL 25 MG/ML IJ SOLN: 6.2500 mg | INTRAMUSCULAR | Status: DC | PRN

## 2017-05-15 MED ORDER — METHOCARBAMOL 500 MG PO TABS: 500.0000 mg | ORAL_TABLET | Freq: Three times a day (TID) | ORAL | 0 refills | Status: DC

## 2017-05-15 MED ORDER — DOCUSATE SODIUM 100 MG PO CAPS: 100.0000 mg | ORAL_CAPSULE | Freq: Two times a day (BID) | ORAL | 0 refills | Status: DC

## 2017-05-15 SURGICAL SUPPLY — 50 items
BANDAGE ACE 3X5.8 VEL STRL LF (GAUZE/BANDAGES/DRESSINGS) IMPLANT
BANDAGE ACE 4X5 VEL STRL LF (GAUZE/BANDAGES/DRESSINGS) ×1 IMPLANT
BNDG CMPR 9X4 STRL LF SNTH (GAUZE/BANDAGES/DRESSINGS) ×1
BNDG CMPR MED 10X6 ELC LF (GAUZE/BANDAGES/DRESSINGS) ×1
BNDG ELASTIC 6X10 VLCR STRL LF (GAUZE/BANDAGES/DRESSINGS) ×1 IMPLANT
BNDG ESMARK 4X9 LF (GAUZE/BANDAGES/DRESSINGS) ×1 IMPLANT
BNDG GAUZE ELAST 4 BULKY (GAUZE/BANDAGES/DRESSINGS) ×1 IMPLANT
CORDS BIPOLAR (ELECTRODE) ×2 IMPLANT
COVER SURGICAL LIGHT HANDLE (MISCELLANEOUS) ×2 IMPLANT
CUFF TOURNIQUET SINGLE 18IN (TOURNIQUET CUFF) ×2 IMPLANT
CUFF TOURNIQUET SINGLE 24IN (TOURNIQUET CUFF) IMPLANT
DRAIN PENROSE 18X1/4 LTX STRL (WOUND CARE) ×1 IMPLANT
DRAPE SURG 17X23 STRL (DRAPES) ×2 IMPLANT
DRSG ADAPTIC 3X8 NADH LF (GAUZE/BANDAGES/DRESSINGS) ×1 IMPLANT
GAUZE SPONGE 4X4 12PLY STRL (GAUZE/BANDAGES/DRESSINGS) ×1 IMPLANT
GLOVE BIOGEL PI IND STRL 8.5 (GLOVE) ×1 IMPLANT
GLOVE BIOGEL PI INDICATOR 8.5 (GLOVE) ×1
GLOVE SURG ORTHO 8.0 STRL STRW (GLOVE) ×2 IMPLANT
GOWN STRL REUS W/ TWL LRG LVL3 (GOWN DISPOSABLE) ×2 IMPLANT
GOWN STRL REUS W/ TWL XL LVL3 (GOWN DISPOSABLE) ×1 IMPLANT
GOWN STRL REUS W/TWL LRG LVL3 (GOWN DISPOSABLE) ×4
GOWN STRL REUS W/TWL XL LVL3 (GOWN DISPOSABLE) ×2
KIT BASIN OR (CUSTOM PROCEDURE TRAY) ×2 IMPLANT
KIT ROOM TURNOVER OR (KITS) ×2 IMPLANT
MARKER SKIN DUAL TIP RULER LAB (MISCELLANEOUS) ×1 IMPLANT
NDL HYPO 25GX1X1/2 BEV (NEEDLE) IMPLANT
NEEDLE HYPO 25GX1X1/2 BEV (NEEDLE) ×2 IMPLANT
NS IRRIG 1000ML POUR BTL (IV SOLUTION) ×2 IMPLANT
PACK ORTHO EXTREMITY (CUSTOM PROCEDURE TRAY) ×2 IMPLANT
PAD ARMBOARD 7.5X6 YLW CONV (MISCELLANEOUS) ×4 IMPLANT
PAD CAST 4YDX4 CTTN HI CHSV (CAST SUPPLIES) IMPLANT
PADDING CAST COTTON 4X4 STRL (CAST SUPPLIES) ×2
PADDING CAST COTTON 6X4 STRL (CAST SUPPLIES) ×1 IMPLANT
SOAP 2 % CHG 4 OZ (WOUND CARE) ×2 IMPLANT
SOL PREP POV-IOD 4OZ 10% (MISCELLANEOUS) ×2 IMPLANT
SPLINT FIBERGLASS 4X30 (CAST SUPPLIES) ×1 IMPLANT
SUT MERSILENE 4 0 P 3 (SUTURE) IMPLANT
SUT MNCRL AB 3-0 PS2 18 (SUTURE) ×2 IMPLANT
SUT PROLENE 4 0 PS 2 18 (SUTURE) ×3 IMPLANT
SUT VIC AB 2-0 CT1 27 (SUTURE)
SUT VIC AB 2-0 CT1 TAPERPNT 27 (SUTURE) IMPLANT
SUT VIC AB 2-0 FS1 27 (SUTURE) ×1 IMPLANT
SUT VIC AB 2-0 SH 27 (SUTURE) ×2
SUT VIC AB 2-0 SH 27X BRD (SUTURE) IMPLANT
SYR CONTROL 10ML LL (SYRINGE) IMPLANT
TOWEL OR 17X24 6PK STRL BLUE (TOWEL DISPOSABLE) ×2 IMPLANT
TOWEL OR 17X26 10 PK STRL BLUE (TOWEL DISPOSABLE) ×2 IMPLANT
TUBE CONNECTING 12X1/4 (SUCTIONS) IMPLANT
UNDERPAD 30X30 (UNDERPADS AND DIAPERS) ×2 IMPLANT
WATER STERILE IRR 1000ML POUR (IV SOLUTION) ×2 IMPLANT

## 2017-05-15 NOTE — Transfer of Care (Signed)
Immediate Anesthesia Transfer of Care Note  Patient: Roland Earldward P Antonucci  Procedure(s) Performed: Right elbow ulnar nerve decompression and/or transposition and nerve wrapping (Right )  Patient Location: PACU  Anesthesia Type:General  Level of Consciousness: oriented, sedated, drowsy, patient cooperative and responds to stimulation  Airway & Oxygen Therapy: Patient Spontanous Breathing  Post-op Assessment: Report given to RN, Post -op Vital signs reviewed and stable and Patient moving all extremities X 4  Post vital signs: Reviewed and stable  Last Vitals:  Vitals:   05/15/17 1410 05/15/17 2002  Pulse: 100   Resp: 18   Temp: 37 C 36.5 C  SpO2: 100%     Last Pain:  Vitals:   05/15/17 1410  TempSrc: Oral      Patients Stated Pain Goal: 0 (05/15/17 1413)  Complications: No apparent anesthesia complications

## 2017-05-15 NOTE — Op Note (Signed)
PREOPERATIVE DIAGNOSIS:right elbow cubital tunnel, recurrent  POSTOPERATIVE DIAGNOSIS:right elbow cubital tunnel,recurrent  ATTENDING SURGEON:Dr. Gilman SchmidtFred Ortman who was scrubbed and present for the entire procedure  ASSISTANT SURGEON:Samantha Willaim RayasBarton PA-C who was scrubbed and necessary for nerve exposure nerve release closure and splinting  ANESTHESIA:Gen. Via laryngeal mask airway  OPERATIVE PROCEDURE:#1: Right elbow ulnar nerve release, cubital tunnel release #2: Right elbow flexor pronator lengthening  IMPLANTS:none  RADIOGRAPHIC INTERPRETATION:none  SURGICAL INDICATIONS:Mr. Jeremy Owens is a right-hand-dominant gentleman who previously undergone ulnar nerve release over 22 years ago. 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 To risks benefits and alternatives were discussed in detail with the patient in a signed informed consent was obtained. Risks include but not limited of bleeding infection damage to nearby nerves arteries or tendons loss of motion of the wrists and digits incomplete relief of symptoms and need for further surgical intervention.  SURGICAL TECHNIQUE:the patient was properly identified in the preoperative holding area and a mark with a permanent marker made on the right elbow to indicate correct operative site. The patient was then brought back to operating room placed supine on anesthesia and table where general anesthesia was administered. Patient tolerated this well. A well-padded tourniquet was then placed on the right brachium and sealed with the appropriate drape. The right upper extremity was then prepped and draped in normal sterile fashion. A timeout was called the correct site was identified and the procedure was then begun. And the previous posterior medial incision was then lengthened proximally and distally. Dissection was carried down through the skin and subcutaneous tissue to protect the medial  antebrachial cutaneous nerve. This was carefully isolated and retracted. The ulnar nerve was identified proximally. The nerve did not appear to be released posteriorly and proximally. It was transposed anteriorly. There appeared to be markedly compression at the site of transposition anteriorly the transposition anteriorly appeared to be maintained with an adipose sling. The nerve was carefully identified proximally traced through the site of transposition through the flexor pronator and through the 2 heads of the flexor carpi ulnaris. The nerve was then carefully elevated handled and removed from the previous site of transposition removing it from the adherent soft tissue. A Penrose drain was applied around the nerve using a no touch technique around the nerve the nerve was able to be mobilized proximally and distally. After mobilization of the nerve felt it was amendable to further transposition creating a fascial sling. The flexor pronator was then elevated with a 2 x 2 centimeter fascial sling anteriorly. The remaining portion of the flexor pronator with careful protection of the nerve was then lengthened in a Z fashion. Flexor pronator lengthening was done distally. This allowed the nerve to be mobilized well. After mobilization of the nerve the fascial sling was then sewn anteriorly with 2-0 Vicryl suture. The elbow was placed through a full range of motion and there was good excursion of the nerve without any evidence of compression. Portion of the intermuscular septum had been resected proximally. The nerve was lying in a good position. The subcutaneous tissues were then closed with Monocryl. Skin was closed with 4-0 Prolene suture. 10 mL accord percent plain Marcaine infiltrated locally around the region. Adaptic dressing and a sterile compressive bandage then applied. The patient was then placed in a long-arm posterior splint and extubated taken recovery room in good condition.   POSTOPERATIVE PLAN:The  patient is to be discharged to home. The patient be seen back  in the office in approximately 2 weeks for wound check suture removal and begin a postoperative ulnar nerve anterior transposition protocol.

## 2017-05-15 NOTE — Progress Notes (Signed)
Orthopedic Tech Progress Note Patient Details:  Jeremy Owens [birth year] [id number]  Ortho Devices Type of Ortho Device: Arm sling Ortho Device/Splint Location: RUE Ortho Device/Splint Interventions: Ordered, Application   Jennye MoccasinHughes, [name] 05/15/2017, 8:25 PM

## 2017-05-15 NOTE — Discharge Instructions (Signed)
KEEP BANDAGE CLEAN AND DRY °CALL OFFICE FOR F/U APPT [phone] IN 14 DAYS °DR Shaleigh Laubscher CELL [phone] °KEEP HAND ELEVATED ABOVE HEART °OK TO APPLY ICE TO OPERATIVE AREA °CONTACT OFFICE IF ANY WORSENING PAIN OR CONCERNS. °

## 2017-05-15 NOTE — Anesthesia Procedure Notes (Signed)
Procedure Name: Intubation Date/Time: 05/15/2017 6:39 PM Performed by: Claris Che, CRNA Pre-anesthesia Checklist: Patient identified, Emergency Drugs available, Suction available, Patient being monitored and Timeout performed Patient Re-evaluated:Patient Re-evaluated prior to induction Oxygen Delivery Method: Circle system utilized Preoxygenation: Pre-oxygenation with 100% oxygen Induction Type: IV induction and Cricoid Pressure applied Ventilation: Mask ventilation without difficulty Laryngoscope Size: Mac and 3 Grade View: Grade II Tube type: Oral Tube size: 8.0 mm Number of attempts: 1 Airway Equipment and Method: Stylet Placement Confirmation: ETT inserted through vocal cords under direct vision,  positive ETCO2 and breath sounds checked- equal and bilateral Secured at: 23 cm Tube secured with: Tape Dental Injury: Teeth and Oropharynx as per pre-operative assessment

## 2017-05-15 NOTE — Anesthesia Postprocedure Evaluation (Signed)
Anesthesia Post Note  Patient: Jeremy Owens  Procedure(s) Performed: Right elbow ulnar nerve decompression and/or transposition and nerve wrapping (Right )     Patient location during evaluation: PACU Anesthesia Type: General Level of consciousness: awake and alert Pain management: pain level controlled Vital Signs Assessment: post-procedure vital signs reviewed and stable Respiratory status: spontaneous breathing, nonlabored ventilation, respiratory function stable and patient connected to nasal cannula oxygen Cardiovascular status: blood pressure returned to baseline and stable Postop Assessment: no apparent nausea or vomiting Anesthetic complications: no    Last Vitals:  Vitals:   05/15/17 2015 05/15/17 2030  BP: (!) 142/99 (!) 129/93  Pulse: (!) 102 98  Resp: 18 13  Temp:    SpO2: 96% 92%    Last Pain:  Vitals:   05/15/17 2015  TempSrc:   PainSc: 5                  Malaika Arnall

## 2017-05-15 NOTE — Anesthesia Preprocedure Evaluation (Signed)
Anesthesia Evaluation  Patient identified by MRN, date of birth, ID band Patient awake    Reviewed: Allergy & Precautions, NPO status , Patient's Chart, lab work & pertinent test results  Airway Mallampati: II  TM Distance: >3 FB Neck ROM: Full    Dental no notable dental hx.    Pulmonary neg pulmonary ROS,    Pulmonary exam normal breath sounds clear to auscultation       Cardiovascular negative cardio ROS Normal cardiovascular exam Rhythm:Regular Rate:Normal     Neuro/Psych negative neurological ROS  negative psych ROS   GI/Hepatic negative GI ROS, Neg liver ROS,   Endo/Other  negative endocrine ROS  Renal/GU negative Renal ROS  negative genitourinary   Musculoskeletal negative musculoskeletal ROS (+) Arthritis , Osteoarthritis,    Abdominal   Peds negative pediatric ROS (+)  Hematology negative hematology ROS (+)   Anesthesia Other Findings   Reproductive/Obstetrics negative OB ROS                             Anesthesia Physical Anesthesia Plan  ASA: II  Anesthesia Plan: General   Post-op Pain Management:    Induction: Intravenous  PONV Risk Score and Plan: 2 and Ondansetron and Midazolam  Airway Management Planned: LMA  Additional Equipment:   Intra-op Plan:   Post-operative Plan: Extubation in OR  Informed Consent: I have reviewed the patients History and Physical, chart, labs and discussed the procedure including the risks, benefits and alternatives for the proposed anesthesia with the patient or authorized representative who has indicated his/her understanding and acceptance.   Dental advisory given  Plan Discussed with: CRNA  Anesthesia Plan Comments:         Anesthesia Quick Evaluation

## 2017-05-16 ENCOUNTER — Encounter (HOSPITAL_COMMUNITY): Payer: Self-pay | Admitting: Orthopedic Surgery

## 2017-10-19 ENCOUNTER — Encounter: Payer: Self-pay | Admitting: Emergency Medicine

## 2017-10-19 ENCOUNTER — Other Ambulatory Visit: Payer: Self-pay

## 2017-10-19 ENCOUNTER — Emergency Department
Admission: EM | Admit: 2017-10-19 | Discharge: 2017-10-19 | Disposition: A | Discharge status: Home / Self Care | Payer: BLUE CROSS/BLUE SHIELD | Attending: Emergency Medicine | Admitting: Emergency Medicine

## 2017-10-19 DIAGNOSIS — R319 Hematuria, unspecified: Secondary | ICD-10-CM | POA: Insufficient documentation

## 2017-10-19 DIAGNOSIS — Z96651 Presence of right artificial knee joint: Secondary | ICD-10-CM | POA: Diagnosis not present

## 2017-10-19 DIAGNOSIS — F17220 Nicotine dependence, chewing tobacco, uncomplicated: Secondary | ICD-10-CM | POA: Insufficient documentation

## 2017-10-19 DIAGNOSIS — N23 Unspecified renal colic: Secondary | ICD-10-CM | POA: Diagnosis not present

## 2017-10-19 DIAGNOSIS — R1032 Left lower quadrant pain: Secondary | ICD-10-CM | POA: Diagnosis present

## 2017-10-19 LAB — CBC
HEMATOCRIT: 44.5 % (ref 40.0–52.0)
Hemoglobin: 15.6 g/dL (ref 13.0–18.0)
MCH: 31.8 pg (ref 26.0–34.0)
MCHC: 35.2 g/dL (ref 32.0–36.0)
MCV: 90.4 fL (ref 80.0–100.0)
Platelets: 203 10*3/uL (ref 150–440)
RBC: 4.92 MIL/uL (ref 4.40–5.90)
RDW: 14 % (ref 11.5–14.5)
WBC: 12 10*3/uL — AB (ref 3.8–10.6)

## 2017-10-19 LAB — COMPREHENSIVE METABOLIC PANEL
ALT: 49 U/L (ref 17–63)
AST: 28 U/L (ref 15–41)
Albumin: 4.1 g/dL (ref 3.5–5.0)
Alkaline Phosphatase: 53 U/L (ref 38–126)
Anion gap: 7 (ref 5–15)
BUN: 10 mg/dL (ref 6–20)
CHLORIDE: 106 mmol/L (ref 101–111)
CO2: 24 mmol/L (ref 22–32)
Calcium: 8.8 mg/dL — ABNORMAL LOW (ref 8.9–10.3)
Creatinine, Ser: 1.03 mg/dL (ref 0.61–1.24)
GFR calc Af Amer: >60 mL/min (ref >60)
Glucose, Bld: 113 mg/dL — ABNORMAL HIGH (ref 65–99)
Potassium: 3.6 mmol/L (ref 3.5–5.1)
SODIUM: 137 mmol/L (ref 135–145)
Total Bilirubin: 1.4 mg/dL — ABNORMAL HIGH (ref 0.3–1.2)
Total Protein: 8.1 g/dL (ref 6.5–8.1)

## 2017-10-19 LAB — URINALYSIS, COMPLETE (UACMP) WITH MICROSCOPIC
Bilirubin Urine: NEGATIVE
Glucose, UA: NEGATIVE mg/dL
Ketones, ur: NEGATIVE mg/dL
LEUKOCYTES UA: NEGATIVE
Nitrite: NEGATIVE
PH: 5 (ref 5.0–8.0)
Protein, ur: NEGATIVE mg/dL
SPECIFIC GRAVITY, URINE: 1.011 (ref 1.005–1.030)

## 2017-10-19 LAB — LIPASE, BLOOD: LIPASE: 30 U/L (ref 11–51)

## 2017-10-19 MED ORDER — KETOROLAC TROMETHAMINE 30 MG/ML IJ SOLN
15.0000 mg | Freq: Once | INTRAMUSCULAR | Status: AC
  Administered 2017-10-19: 15 mg via INTRAVENOUS
  Filled 2017-10-19: qty 1

## 2017-10-19 MED ORDER — SODIUM CHLORIDE 0.9 % IV BOLUS: 500.0000 mL | Freq: Once | INTRAVENOUS | Status: DC

## 2017-10-19 MED ORDER — ONDANSETRON HCL 4 MG PO TABS: 4.0000 mg | ORAL_TABLET | Freq: Three times a day (TID) | ORAL | 0 refills | Status: DC | PRN

## 2017-10-19 MED ORDER — ONDANSETRON HCL 4 MG/2ML IJ SOLN
4.0000 mg | Freq: Once | INTRAMUSCULAR | Status: AC
  Administered 2017-10-19: 4 mg via INTRAVENOUS
  Filled 2017-10-19: qty 2

## 2017-10-19 MED ORDER — OXYCODONE-ACETAMINOPHEN 5-325 MG PO TABS
1.0000 | ORAL_TABLET | Freq: Once | ORAL | Status: AC
  Administered 2017-10-19: 1 via ORAL
  Filled 2017-10-19: qty 1

## 2017-10-19 MED ORDER — TAMSULOSIN HCL 0.4 MG PO CAPS: 0.4000 mg | ORAL_CAPSULE | Freq: Every day | ORAL | 0 refills | Status: DC

## 2017-10-19 MED ORDER — IBUPROFEN 200 MG PO TABS: 600.0000 mg | ORAL_TABLET | Freq: Four times a day (QID) | ORAL | 0 refills | Status: DC | PRN

## 2017-10-19 MED ORDER — OXYCODONE-ACETAMINOPHEN 5-325 MG PO TABS: 1.0000 | ORAL_TABLET | ORAL | 0 refills | Status: DC | PRN

## 2017-10-19 NOTE — ED Triage Notes (Signed)
First Nurse Note:  C/O LLQ abdominal pain x 1 day.  States pain not improving and today c/o nausea.

## 2017-10-19 NOTE — ED Provider Notes (Addendum)
[facility] Emergency Department Provider Note  ____________________________________________   I have reviewed the triage vital signs and the nursing notes. Where available I have reviewed prior notes and, if possible and indicated, outside hospital notes.    HISTORY  Chief Complaint Abdominal Pain    HPI Jeremy Owens is a 42 y.o. male with a history of kidney stones in the past, presents today with sudden onset left flank pain began yesterday radiates to the groin, nausea but no vomiting no fever no dysuria, no hematuria, pain began in the left flank, nothing makes it better nothing makes it worse, seems to wax and wane, no trauma, no change in stooling.  Sharp discomfort.  No other associated symptoms or prior treatment.   Past Medical History:  Diagnosis Date  . Medical history non-contributory     Patient Active Problem List   Diagnosis Date Noted  . Osteoarthritis of knee 03/07/2017  . Right knee injury   . Synovitis of knee 01/11/2016  . History of total knee arthroplasty 01/11/2016  . H/O elbow surgery 03/08/1995    Past Surgical History:  Procedure Laterality Date  . HARDWARE REMOVAL Right 2010  . KNEE ARTHROSCOPY Right 2017  . MEDIAL PARTIAL KNEE REPLACEMENT Right 2013  . ORIF  2010   right knee  . OTHER SURGICAL HISTORY     right elbow nerve decompression  . ULNAR NERVE TRANSPOSITION Right 05/15/2017   Procedure: Right elbow ulnar nerve decompression and/or transposition and nerve wrapping;  Surgeon: Bradly Bienenstockrtmann, Fred, MD;  [facility];  Service: Orthopedics;  Laterality: Right;  90 mins    Prior to Admission medications   Medication Sig Start Date End Date Taking? Authorizing Provider  docusate sodium (COLACE) 100 MG capsule Take 1 capsule (100 mg total) 2 (two) times daily by mouth. 05/15/17   Bradly Bienenstockrtmann, Fred, MD  methocarbamol (ROBAXIN) 500 MG tablet Take 1 tablet (500 mg total) 3 (three) times daily by mouth. 05/15/17   Bradly Bienenstockrtmann,  Fred, MD    Allergies Sulfa antibiotics  No family history on file.  Social History Social History   Tobacco Use  . Smoking status: Never Smoker  . Smokeless tobacco: Current User    Types: Chew  Substance Use Topics  . Alcohol use: Yes    Alcohol/week: 0.0 oz    Comment: rare  . Drug use: No    Review of Systems Constitutional: No fever/chills Eyes: No visual changes. ENT: No sore throat. No stiff neck no neck pain Cardiovascular: Denies chest pain. Respiratory: Denies shortness of breath. Gastrointestinal:   no vomiting.  No diarrhea.  No constipation. Genitourinary: Negative for dysuria. Musculoskeletal: Negative lower extremity swelling Skin: Negative for rash. Neurological: Negative for severe headaches, focal weakness or numbness.   ____________________________________________   PHYSICAL EXAM:  VITAL SIGNS: ED Triage Vitals  Enc Vitals Group     BP 10/19/17 0803 (!) 147/94     Pulse Rate 10/19/17 0803 (!) 103     Resp 10/19/17 0803 18     Temp 10/19/17 0803 98.5 F (36.9 C)     Temp Source 10/19/17 0803 Oral     SpO2 10/19/17 0803 100 %     Weight 10/19/17 0804 215 lb (97.5 kg)     Height 10/19/17 0804 5\' 11"  (1.803 m)     Head Circumference --      Peak Flow --      Pain Score 10/19/17 0804 7     Pain Loc --  Pain Edu? --      Excl. in GC? --     Constitutional: Alert and oriented. Well appearing and in no acute distress. Eyes: Conjunctivae are normal Head: Atraumatic HEENT: No congestion/rhinnorhea. Mucous membranes are moist.  Oropharynx non-erythematous Neck:   Nontender with no meningismus, no masses, no stridor Cardiovascular: Normal rate, regular rhythm. Grossly normal heart sounds.  Good peripheral circulation. Respiratory: Normal respiratory effort.  No retractions. Lungs CTAB. Abdominal: Soft and some tenderness noted to the left lower quadrant around the UVJ region. No distention. No guarding no rebound Back:  There is no focal  tenderness or step off.  there is no midline tenderness there are no lesions noted. there is positive left CVA tenderness GU: No tenderness to palpation no testicular mass or swelling normal external male genitalia no herniation noted Musculoskeletal: No lower extremity tenderness, no upper extremity tenderness. No joint effusions, no DVT signs strong distal pulses no edema Neurologic:  Normal speech and language. No gross focal neurologic deficits are appreciated.  Skin:  Skin is warm, dry and intact. No rash noted. Psychiatric: Mood and affect are normal. Speech and behavior are normal.  ____________________________________________   LABS (all labs ordered are listed, but only abnormal results are displayed)  Labs Reviewed  LIPASE, BLOOD  COMPREHENSIVE METABOLIC PANEL  CBC  URINALYSIS, COMPLETE (UACMP) WITH MICROSCOPIC    Pertinent labs  results that were available during my care of the patient were reviewed by me and considered in my medical decision making (see chart for details). ____________________________________________  EKG  I personally interpreted any EKGs ordered by me or triage  ____________________________________________  RADIOLOGY  Pertinent labs & imaging results that were available during my care of the patient were reviewed by me and considered in my medical decision making (see chart for details). If possible, patient and/or family made aware of any abnormal findings.  No results found. ____________________________________________    PROCEDURES  Procedure(s) performed: None  Procedures  Critical Care performed: None  ____________________________________________   INITIAL IMPRESSION / ASSESSMENT AND PLAN / ED COURSE  Pertinent labs & imaging results that were available during my care of the patient were reviewed by me and considered in my medical decision making (see chart for details).  She with history of kidney stones with sudden onset flank  pain radiating to his groin, reproducible, no testicular mass or swelling, no evidence of hernia, patient most likely has a kidney stone.  We will see what his urine shows if he has hematuria and gets better with Toradol, I suspect he probably would do better without the radiation burden of a CT scan.  Blood work is pending, patient in no acute distress   ----------------------------------------- 10:18 AM on 10/19/2017 -----------------------------------------  he is feeling much better after Toradol, still with some residual discomfort, exam including serial abdominal exams benign, patient and I and his wife had a very long discussion about the patient's most likely diagnosis which is kidney stone.  Patient has hematuria, sudden onset flank pain radiating to the groin, normal GU exam, and known history of renal colic.  I did explain to him that a CT scan, while diagnostic is certainly not treating a his symptoms or his stone, and I do not think in this particular case is in his best interest to have a CT scan.  I have low suspicion for other left lower quadrant entities such as oncologic process, diverticulitis etc.  He understands the limitations of the workup without CT scan and  the fact that we may therefore have some diagnoses that may go undetected, he is very comfortable with this.  He feels that this is consistent with a kidney stone.  He would like to go home without the radiation burden of his CT scan.  I do not think this is unreasonable.  Return precautions and follow-up given and understood as well as the need for follow-up.  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  He is overall very well-appearing with all the signs and symptoms of a kidney stone with a history of kidney stones.    ____________________________________________   FINAL CLINICAL IMPRESSION(S) / ED DIAGNOSES  Final diagnoses:  None      This chart was dictated using voice recognition software.  Despite best efforts to proofread,  errors can occur which can change meaning.      Jeanmarie Plant, MD 10/19/17 Theodis Blaze    Jeanmarie Plant, MD 10/19/17 1020

## 2017-10-19 NOTE — Discharge Instructions (Signed)
We feel very strongly your most probable diagnosis is kidney stones.  You and I have discussed extensively whether or not we would like to get a CT scan and he agreed at this time not to do so.  I do not think this is unreasonable, however, if you do have fever, increased pain, persistent vomiting, bleeding, black stool, or any other new or worrisome symptoms we do strongly advise that you return to the emergency department.  Please follow closely with urology.  Take the medication as prescribed.  Do not drink drive or operate machinery etc. on Percocet.

## 2017-10-19 NOTE — ED Notes (Signed)
Pt c/o abdominal pain and nausea. Pt has tenderness on the left side of abdomen, worse with palpation. Pt has tenderness on left side with palpation on right side as well.

## 2017-10-19 NOTE — ED Triage Notes (Signed)
L lower abd pain since yesterday.

## 2017-10-22 ENCOUNTER — Other Ambulatory Visit: Payer: Self-pay | Admitting: Radiology

## 2017-10-22 ENCOUNTER — Ambulatory Visit
Admission: RE | Admit: 2017-10-22 | Discharge: 2017-10-22 | Disposition: A | Discharge status: Home / Self Care | Payer: BLUE CROSS/BLUE SHIELD | Source: Ambulatory Visit | Attending: Urology | Admitting: Urology

## 2017-10-22 ENCOUNTER — Encounter: Payer: Self-pay | Admitting: Urology

## 2017-10-22 ENCOUNTER — Ambulatory Visit: Payer: BLUE CROSS/BLUE SHIELD | Admitting: Urology

## 2017-10-22 ENCOUNTER — Other Ambulatory Visit: Payer: Self-pay | Admitting: Urology

## 2017-10-22 VITALS — BP 130/93 | HR 102 | Ht 71.0 in | Wt 215.0 lb

## 2017-10-22 DIAGNOSIS — N201 Calculus of ureter: Secondary | ICD-10-CM

## 2017-10-22 DIAGNOSIS — N200 Calculus of kidney: Secondary | ICD-10-CM

## 2017-10-22 LAB — URINALYSIS, COMPLETE
BILIRUBIN UA: NEGATIVE
Glucose, UA: NEGATIVE
Ketones, UA: NEGATIVE
LEUKOCYTES UA: NEGATIVE
Nitrite, UA: NEGATIVE
PH UA: 5 (ref 5.0–7.5)
Protein, UA: NEGATIVE
Specific Gravity, UA: 1.015 (ref 1.005–1.030)
Urobilinogen, Ur: 0.2 mg/dL (ref 0.2–1.0)

## 2017-10-22 LAB — MICROSCOPIC EXAMINATION
Bacteria, UA: NONE SEEN
EPITHELIAL CELLS (NON RENAL): NONE SEEN /HPF (ref 0–10)

## 2017-10-22 MED ORDER — KETOROLAC TROMETHAMINE 15.75 MG/SPRAY NA SOLN: 1.0000 | NASAL | 0 refills | Status: DC | PRN

## 2017-10-22 MED ORDER — TAMSULOSIN HCL 0.4 MG PO CAPS: 0.4000 mg | ORAL_CAPSULE | Freq: Every day | ORAL | 0 refills | Status: DC

## 2017-10-22 MED ORDER — HYDROCODONE-ACETAMINOPHEN 5-325 MG PO TABS: 1.0000 | ORAL_TABLET | Freq: Four times a day (QID) | ORAL | 0 refills | Status: DC | PRN

## 2017-10-22 NOTE — Progress Notes (Signed)
10/22/2017 9:30 AM   Jeremy Owens 1975/07/20 [id number]  Referring provider: No referring provider defined for this encounter.  Chief Complaint  Patient presents with  . Nephrolithiasis    New Patient    HPI:  42 year old male with a remote history of nephrolithiasis who presented to the emergency room over the weekend on 10/19/2017 with 1 day of sudden onset left lower quadrant pain.  In the emergency room, he was noted to have evidence of microscopic hematuria otherwise no signs or symptoms of infection.  No imaging was obtained.  Today, he continues to have left lower quadrant pain at his belt line radiating towards his bladder.  He describes the pain as 3-4/10 intensity but waves of more intense pain up to 8/10.  He initially had some associated nausea without vomiting but this has resolved.  No dysuria or gross hematuria.  No fevers or chills.  He has been breaking out into sweats which he associates with the pain.    He has a remote history of kidney stones, spontaneously passed a stone about 20 years ago which was relatively small.  He is never required surgical intervention for stones.   PMH: Past Medical History:  Diagnosis Date  . Medical history non-contributory     Surgical History: Past Surgical History:  Procedure Laterality Date  . HARDWARE REMOVAL Right 2010  . KNEE ARTHROSCOPY Right 2017  . MEDIAL PARTIAL KNEE REPLACEMENT Right 2013  . ORIF  2010   right knee  . OTHER SURGICAL HISTORY     right elbow nerve decompression  . ULNAR NERVE TRANSPOSITION Right 05/15/2017   Procedure: Right elbow ulnar nerve decompression and/or transposition and nerve wrapping;  Surgeon: Bradly Bienenstock, MD;  Location: [facility] OR;  Service: Orthopedics;  Laterality: Right;  90 mins    Home Medications:  Allergies as of 10/22/2017      Reactions   Sulfa Antibiotics Diarrhea      Medication List        Accurate as of 10/22/17  9:30 AM. Always use your most recent med list.          ibuprofen 200 MG tablet Commonly known as:  MOTRIN IB Take 3 tablets (600 mg total) by mouth every 6 (six) hours as needed.   ondansetron 4 MG tablet Commonly known as:  ZOFRAN Take 1 tablet (4 mg total) by mouth every 8 (eight) hours as needed for nausea or vomiting.   tamsulosin 0.4 MG Caps capsule Commonly known as:  FLOMAX Take 1 capsule (0.4 mg total) by mouth daily.       Allergies:  Allergies  Allergen Reactions  . Sulfa Antibiotics Diarrhea    Family History: Family History  Problem Relation Age of Onset  . Prostate cancer Neg Hx   . Kidney cancer Neg Hx   . Bladder Cancer Neg Hx     Social History:  reports that he has never smoked. His smokeless tobacco use includes chew. He reports that he drinks alcohol. He reports that he does not use drugs.  ROS: UROLOGY Frequent Urination?: Yes Hard to postpone urination?: No Burning/pain with urination?: No Get up at night to urinate?: Yes Leakage of urine?: No Urine stream starts and stops?: Yes Trouble starting stream?: No Do you have to strain to urinate?: No Blood in urine?: No Urinary tract infection?: No Sexually transmitted disease?: No Injury to kidneys or bladder?: No Painful intercourse?: No Weak stream?: No Erection problems?: No Penile pain?: No  Gastrointestinal Nausea?:  No Vomiting?: No Indigestion/heartburn?: No Diarrhea?: No Constipation?: No  Constitutional Fever: No Night sweats?: No Weight loss?: No Fatigue?: No  Skin Skin rash/lesions?: No Itching?: No  Eyes Blurred vision?: No Double vision?: No  Ears/Nose/Throat Sore throat?: No Sinus problems?: Yes  Hematologic/Lymphatic Swollen glands?: No Easy bruising?: No  Cardiovascular Leg swelling?: No Chest pain?: No  Respiratory Cough?: No Shortness of breath?: No  Endocrine Excessive thirst?: No  Musculoskeletal Back pain?: No Joint pain?: No  Neurological Headaches?: No Dizziness?:  No  Psychologic Depression?: No Anxiety?: No  Physical Exam: BP (!) 130/93   Pulse (!) 102   Ht 5\' 11"  (1.803 m)   Wt 215 lb (97.5 kg)   BMI 29.99 kg/m   Constitutional:  Alert and oriented, No acute distress.  Accompanied by wife today. HEENT: [place] AT, moist mucus membranes.  Trachea midline, no masses. Cardiovascular: No clubbing, cyanosis, or edema. Respiratory: Normal respiratory effort, no increased work of breathing. GI: Abdomen is soft, nontender, nondistended, no abdominal masses.  Obese.   GU: No CVA tenderness. Skin: No rashes, bruises or suspicious lesions. Neurologic: Grossly intact, no focal deficits, moving all 4 extremities. Psychiatric: Normal mood and affect.  Laboratory Data: Lab Results  Component Value Date   WBC 12.0 (H) 10/19/2017   HGB 15.6 10/19/2017   HCT 44.5 10/19/2017   MCV 90.4 10/19/2017   PLT 203 10/19/2017    Lab Results  Component Value Date   CREATININE 1.03 10/19/2017    Urinalysis UA today with trace blood on dip, but negative on microscopy.  Pertinent Imaging: KUB personally reviewed today, there is a 5 mm either left UVJ stone or phlebolith as well as another left-sided phlebolith.  No upper tract stones.  Final read pending.  Assessment & Plan:    1. Left ureteral stone In the setting of persistent left lower quadrant pain and calcification within the left pelvis most likely representing a left distal ureteral calculus, we discussed options on management.  Options include continued medical expulsive therapy with Flomax, straining urine, and supportive care.  In addition, we discussed we discussed various surgical options including ESWL vs. ureteroscopy, laser lithotripsy, and stent. We discussed the risks and benefits of both including bleeding, infection, damage to surrounding structures, efficacy with need for possible further intervention, and need for temporary ureteral stent.  At this point in time, he would like to continue  medical expulsive therapy and tentatively be set up for shockwave lithotripsy next Thursday to give him a little over a week to try to pass the stone passed spontaneously.  Given that there is some concern that the calcification of the left distal pelvis may represent a phlebolith, would like him to have a noncontrast CT scan just prior to shockwave lithotripsy to ensure that this in fact is a distal ureteral calculus.  He is agreeable this plan.  In the meantime, additional prescriptions for Flomax, Vicodin x10, as well as Sprix was supplied to the patient..  Warning symptoms reviewed in detail including indications for more urgent follow-up.  All questions answered.  - Urinalysis, Complete - DG Abd 1 View; Future   Vanna ScotlandAshley Zoriana Oats, MD  [facility] [address], [address] [place], [place] [address] [phone]

## 2017-10-22 NOTE — Addendum Note (Signed)
Addended by: Martha ClanWATTS, Shantoya Geurts M on: 10/22/2017 01:23 PM   Modules accepted: Orders

## 2017-10-28 ENCOUNTER — Telehealth: Payer: Self-pay | Admitting: Radiology

## 2017-10-28 ENCOUNTER — Other Ambulatory Visit: Payer: Self-pay | Admitting: Radiology

## 2017-10-28 DIAGNOSIS — N201 Calculus of ureter: Secondary | ICD-10-CM

## 2017-10-28 NOTE — Telephone Encounter (Signed)
Pt states he is not sure if he passed his stone but experienced severe burning once while urinating over the weekend & has not had any more pain since. He would like to cancel CT & ESWL scheduled 10/31/2017. Advised pt that pain can come & go & this would be discussed with Dr Apolinar Junes prior to cancelling procedure. Pt voices understanding.

## 2017-10-28 NOTE — Telephone Encounter (Signed)
Agreed, if he did not actually see or catch the stone, there is a chance that the stone is still there.  I think he should have a CT scan but if he is concerned about the cost, then we can perhaps compromise with renal ultrasound.  If this shows swelling of the kidney, I will still need to get the CT scan.  Vanna Scotland, MD

## 2017-10-28 NOTE — Telephone Encounter (Signed)
Explained that there is a chance that the stone is still there since he didn't actually see the stone. Discussed proceeding with CT vs renal ultrasound first with the understanding that if there is swelling of the kidney, the CT would still be needed. Pt would like to proceed with renal ultrasound.

## 2017-10-29 ENCOUNTER — Ambulatory Visit
Admission: RE | Admit: 2017-10-29 | Discharge: 2017-10-29 | Disposition: A | Discharge status: Home / Self Care | Payer: BLUE CROSS/BLUE SHIELD | Source: Ambulatory Visit | Attending: Urology | Admitting: Urology

## 2017-10-29 DIAGNOSIS — N201 Calculus of ureter: Secondary | ICD-10-CM | POA: Insufficient documentation

## 2017-10-29 NOTE — Telephone Encounter (Signed)
Pt asks to cancel CT scheduled for 10/30/2017 & shockwave lithotripsy scheduled for 10/31/2017 due to work conflicts. He would like to be called with renal ultrasound results & discuss treatment options at that time. Pt may be reached at [phone].

## 2017-10-30 ENCOUNTER — Ambulatory Visit: Payer: BLUE CROSS/BLUE SHIELD

## 2017-10-30 ENCOUNTER — Ambulatory Visit: Payer: BLUE CROSS/BLUE SHIELD | Admitting: Adult Health

## 2017-10-30 ENCOUNTER — Encounter: Payer: Self-pay | Admitting: Adult Health

## 2017-10-30 VITALS — BP 133/82 | HR 126 | Temp 101.5°F | Resp 16 | Ht 70.0 in | Wt 210.0 lb

## 2017-10-30 DIAGNOSIS — Z87442 Personal history of urinary calculi: Secondary | ICD-10-CM

## 2017-10-30 DIAGNOSIS — M25569 Pain in unspecified knee: Secondary | ICD-10-CM | POA: Insufficient documentation

## 2017-10-30 DIAGNOSIS — M1991 Primary osteoarthritis, unspecified site: Secondary | ICD-10-CM | POA: Insufficient documentation

## 2017-10-30 DIAGNOSIS — R319 Hematuria, unspecified: Secondary | ICD-10-CM

## 2017-10-30 DIAGNOSIS — J019 Acute sinusitis, unspecified: Secondary | ICD-10-CM

## 2017-10-30 DIAGNOSIS — H6692 Otitis media, unspecified, left ear: Secondary | ICD-10-CM

## 2017-10-30 DIAGNOSIS — R509 Fever, unspecified: Secondary | ICD-10-CM

## 2017-10-30 LAB — POCT URINALYSIS DIPSTICK
Appearance: NORMAL
GLUCOSE UA: NEGATIVE
Leukocytes, UA: NEGATIVE
SPEC GRAV UA: 1.025 (ref 1.010–1.025)
Urobilinogen, UA: 1 E.U./dL
pH, UA: 6 (ref 5.0–8.0)

## 2017-10-30 MED ORDER — AMOXICILLIN 875 MG PO TABS: 875.0000 mg | ORAL_TABLET | Freq: Two times a day (BID) | ORAL | 0 refills | Status: DC

## 2017-10-30 NOTE — Progress Notes (Signed)
Subjective:     Patient ID: MACIAH SCHWEIGERT, male   DOB: 11-16-1975, 42 y.o.   MRN: [id number]  HPI   Blood pressure 133/82, pulse (!) 126, temperature (!) 101.5 F (38.6 C), resp. rate 16, height  (1.778 m), weight 210 lb (95.3 kg), SpO2 97 %.  Patient is a 42 year old male in no acute distress who has sinus congestion, mild cough, sinus pressure x 2 weeks. He does have mild fatigue. He reports fever started today. He reports his daughter has also been sick at home in the past week with cold symptoms.   He has taken sudafed at 1 pm - 60 mg today. He denies any history of tachycardia, denies chest pain or pressure. Denies any other cardiac or respiratory symptoms. He has fever in office. He has not been drinking fluids well today. 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   Not sleeping well, nasal congested, sinus pressure, decreased appetie.   Patient  denies any  body aches,chills, rash, chest pain, shortness of breath, nausea, vomiting, or diarrhea.    He also has a history of a kidney stone and he was scheduled for lithotripsy tomorrow but this was canceled as he says he passed kidney stone on 10/26/17  Saturday and was seen  by nephrologistr everything was clear. Ultrasound was yesterday of kidney and   Review of Systems  Constitutional: Positive for fatigue (mild ). Negative for activity change, appetite change, chills, diaphoresis, fever and unexpected weight change.  HENT: Positive for congestion, postnasal drip, rhinorrhea, sinus pressure and sore throat. Negative for dental problem, drooling, ear discharge, ear pain, facial  swelling, hearing loss, mouth sores, nosebleeds, sinus pain, sneezing, tinnitus, trouble swallowing and voice change.   Respiratory: Negative.   Cardiovascular: Negative.   Gastrointestinal: Negative.   Endocrine: Negative.   Genitourinary: Negative for decreased urine volume, difficulty urinating, discharge, dysuria, enuresis, flank pain, frequency, genital sores, hematuria, penile pain, penile swelling, scrotal swelling, testicular pain and urgency.       He reports passed kidney stone this past Saturday 10/26/17 denies any difficulty urinating or pain anywhere.   Musculoskeletal: Negative.   Allergic/Immunologic: Negative for environmental allergies, food allergies and immunocompromised state.        -- Sulfa Antibiotics -- Diarrhea   Neurological: Negative.   Hematological: Negative.   Psychiatric/Behavioral: Negative.       Objective:   Physical Exam  Constitutional: He is oriented to person, place, and time. Vital signs are normal. He appears well-developed and well-nourished. He is active.  Non-toxic appearance. He does not have a sickly appearance. He does not appear ill. No distress. He is not intubated.  Patient is alert and oriented and responsive to questions Engages in eye contact with provider. Speaks in full sentences without any pauses without any shortness of breath.  Patient moves on and off of exam table and in room without difficulty. Gait is normal in hall and in room. Patient is oriented to person place time and situation. Patient answers questions appropriately and engages in conversation.  HENT:  Head: Normocephalic and atraumatic.  Right Ear: Hearing, external ear and ear canal normal. Tympanic membrane is not perforated and not erythematous. A middle ear effusion is present. No decreased hearing is noted.  Left Ear: Hearing, external ear and ear canal normal. Tympanic membrane is erythematous. Tympanic membrane is not perforated. A middle ear effusion is present.  No decreased hearing is noted.  Nose: Mucosal edema and rhinorrhea present. Right sinus exhibits maxillary sinus tenderness. Right sinus exhibits no frontal sinus tenderness. Left sinus exhibits maxillary sinus tenderness. Left sinus exhibits no frontal sinus tenderness.  Mouth/Throat: Uvula is midline and mucous membranes are normal. Posterior oropharyngeal erythema present. No oropharyngeal exudate, posterior oropharyngeal edema or tonsillar abscesses. Tonsils are 1+ on the right. Tonsils are 1+ on the left.  Eyes: Pupils are equal, round, and reactive to light. Conjunctivae, EOM and lids are normal. Right eye exhibits no discharge. Left eye exhibits no discharge. No scleral icterus.  Neck: Trachea normal, normal range of motion, full passive range of motion without pain and phonation normal. Neck supple. Normal carotid pulses, no hepatojugular reflux and no JVD present. No tracheal tenderness, no spinous process tenderness and no muscular tenderness present. Carotid bruit is not present. No neck rigidity. No tracheal deviation, no edema, no erythema and normal range of motion present. No Brudzinski's sign noted.  Cardiovascular: Regular rhythm, normal heart sounds and intact distal pulses. Exam reveals no gallop, no distant heart sounds and no friction rub.  No murmur heard. Manual heart rate by provider 117 beats per minute - after cooling off patient in room - and him removing bullet proof gear  Pulmonary/Chest: Effort normal and breath sounds normal. No accessory muscle usage or stridor. No apnea, no tachypnea and no bradypnea. He is not intubated. No respiratory distress. He has no decreased breath sounds. He has no wheezes. He has no rales. He exhibits no tenderness.  Abdominal: Soft. Normal appearance, normal aorta and bowel sounds are normal. There is no tenderness. There is no rigidity, no rebound, no guarding, no CVA tenderness, no tenderness at McBurney's point and negative Murphy's sign.   Musculoskeletal: Normal range of motion.  Lymphadenopathy:       Head (right side): No submental, no submandibular, no tonsillar, no preauricular, no posterior auricular and no occipital adenopathy present.       Head (left side): No submental, no submandibular, no tonsillar, no preauricular, no posterior auricular and no occipital adenopathy present.    He has no cervical adenopathy.       Right cervical: No superficial cervical, no deep cervical and no posterior cervical adenopathy present.      Left cervical: No superficial cervical, no deep cervical and no posterior cervical adenopathy present.  Neurological: He is alert and oriented to person, place, and time. He has normal strength. He displays normal reflexes. No cranial nerve deficit or sensory deficit. He exhibits normal muscle tone. Coordination and gait normal.  Skin: Skin is warm, dry and intact. No rash noted. He is not diaphoretic. No cyanosis or erythema. No pallor. Nails show no clubbing.  Psychiatric: He has a normal mood and affect. His speech is normal and behavior is normal. Judgment and thought content normal. Cognition and memory are normal.  Vitals reviewed.  Results for orders placed or performed in visit on 10/30/17 (from the past 24 hour(s))  CBC w/Diff     Status: Abnormal   Collection Time: 10/30/17  2:50 PM  Result Value Ref Range   WBC  13.5 (H) 3.4 - 10.8 x10E3/uL   RBC 5.02 4.14 - 5.80 x10E6/uL   Hemoglobin 16.0 13.0 - 17.7 g/dL   Hematocrit 40.9 81.1 - 51.0 %   MCV 88 79 - 97 fL   MCH 31.9 26.6 - 33.0 pg   MCHC 36.1 (H) 31.5 - 35.7 g/dL   RDW 91.4 78.2 - 95.6 %   Platelets 221 150 - 379 x10E3/uL   Neutrophils 81 Not Estab. %   Lymphs 7 Not Estab. %   Monocytes 11 Not Estab. %   Eos 0 Not Estab. %   Basos 1 Not Estab. %   Neutrophils Absolute 11.0 (H) 1.4 - 7.0 x10E3/uL   Lymphocytes Absolute 1.0 0.7 - 3.1 x10E3/uL   Monocytes Absolute 1.4 (H) 0.1 - 0.9 x10E3/uL   EOS (ABSOLUTE) 0.0 0.0 - 0.4 x10E3/uL    Basophils Absolute 0.1 0.0 - 0.2 x10E3/uL   Immature Granulocytes 0 Not Estab. %   Immature Grans (Abs) 0.0 0.0 - 0.1 x10E3/uL   Narrative   Performed at:  [address] [place], [place]  [id number] Lab Director: Jolene Schimke MD, Phone:  [phone] Specimen Comment: STAT RESULTS CALLED TO Marvell Fuller Specimen Comment: 10/30/2017 1647 HRS  POCT urinalysis dipstick     Status: Abnormal   Collection Time: 10/30/17  3:20 PM  Result Value Ref Range   Color, UA yellow    Clarity, UA clear    Glucose, UA neg    Bilirubin, UA small    Ketones, UA small    Spec Grav, UA 1.025 1.010 - 1.025   Blood, UA large    pH, UA 6.0 5.0 - 8.0   Protein, UA 1+    Urobilinogen, UA 1.0 0.2 or 1.0 E.U./dL   Nitrite, UA     Leukocytes, UA Negative Negative   Appearance normal    Odor none        Assessment:     Fever, unspecified fever cause - Plan: CBC w/Diff, Urine Culture, CBC w/Diff, POCT urinalysis dipstick, CANCELED: CBC w/Diff  Acute non-recurrent sinusitis, unspecified location  Left otitis media, unspecified otitis media type  Hematuria, unspecified type  History of kidney stones      Plan:     Meds ordered this encounter  Medications  . amoxicillin (AMOXIL) 875 MG tablet    Sig: Take 1 tablet (875 mg total) by mouth 2 (two) times daily.    Dispense:  20 tablet    Refill:  0   Provider recommended Augmentin per guidelines- patient declines as said he felt Augmentin gave him a UTI in past -educated patient that antibiotics do  not cause UTI's he still declined Augmentin.   Orders Placed This Encounter  Procedures  . Urine Culture  . CBC w/Diff    Standing Status:   Future    Number of Occurrences:   1    Standing Expiration Date:   10/31/2018  . POCT urinalysis dipstick    Urine sent for culture. Discussed mild hematuria and results of urine dipstick - likely post kidney stone and ketones likely with mild  Dehydration.   He will return  to office for two week follow up on urine and repeat urine dipstick as he reports urology did not have him return.   Advised patient call the office or your primary care doctor for an appointment if no improvement within 72 hours or if any symptoms change or worsen at any time  Advised ER or urgent  Care if after hours or on weekend. Call 911 for emergency symptoms at any time.Patinet verbalized understanding of all instructions given/reviewed and treatment plan and has no further questions or concerns at this time.    Patient verbalized understanding of all instructions given and denies any further questions at this time.

## 2017-10-30 NOTE — Telephone Encounter (Signed)
Notes recorded by Vanna Scotland, MD on 10/30/2017 at 9:54 AM EDT Renal ultrasound looks great. Agreed, he probably passed a small stone. There is no evidence of obstruction or blockage. Please follow-up as needed.  Vanna Scotland  Notified pt of above. Pt voices understanding & appreciation of call.

## 2017-10-30 NOTE — Patient Instructions (Addendum)
Dehydration, Adult Dehydration is when there is not enough fluid or water in your body. This happens when you lose more fluids than you take in. Dehydration can range from mild to very bad. It should be treated right away to keep it from getting very bad. Symptoms of mild dehydration may include:  Thirst.  Dry lips.  Slightly dry mouth.  Dry, warm skin.  Dizziness. Symptoms of moderate dehydration may include:  Very dry mouth.  Muscle cramps.  Dark pee (urine). Pee may be the color of tea.  Your body making less pee.  Your eyes making fewer tears.  Heartbeat that is uneven or faster than normal (palpitations).  Headache.  Light-headedness, especially when you stand up from sitting.  Fainting (syncope). Symptoms of very bad dehydration may include:  Changes in skin, such as: ? Cold and clammy skin. ? Blotchy (mottled) or pale skin. ? Skin that does not quickly return to normal after being lightly pinched and let go (poor skin turgor).  Changes in body fluids, such as: ? Feeling very thirsty. ? Your eyes making fewer tears. ? Not sweating when body temperature is high, such as in hot weather. ? Your body making very little pee.  Changes in vital signs, such as: ? Weak pulse. ? Pulse that is more than 100 beats a minute when you are sitting still. ? Fast breathing. ? Low blood pressure.  Other changes, such as: ? Sunken eyes. ? Cold hands and feet. ? Confusion. ? Lack of energy (lethargy). ? Trouble waking up from sleep. ? Short-term weight loss. ? Unconsciousness. Follow these instructions at home:  If told by your doctor, drink an ORS: ? Make an ORS by using instructions on the package. ? Start by drinking small amounts, about  cup (120 mL) every 5-10 minutes. ? Slowly drink more until you have had the amount that your doctor said to have.  Drink enough clear fluid to keep your pee clear or pale yellow. If you were told to drink an ORS, finish the ORS  first, then start slowly drinking clear fluids. Drink fluids such as: ? Water. Do not drink only water by itself. Doing that can make the salt (sodium) level in your body get too low (hyponatremia). ? Ice chips. ? Fruit juice that you have added water to (diluted). ? Low-calorie sports drinks.  Avoid: ? Alcohol. ? Drinks that have a lot of sugar. These include high-calorie sports drinks, fruit juice that does not have water added, and soda. ? Caffeine. ? Foods that are greasy or have a lot of fat or sugar.  Take over-the-counter and prescription medicines only as told by your doctor.  Do not take salt tablets. Doing that can make the salt level in your body get too high (hypernatremia).  Eat foods that have minerals (electrolytes). Examples include bananas, oranges, potatoes, tomatoes, and spinach.  Keep all follow-up visits as told by your doctor. This is important. Contact a doctor if:  You have belly (abdominal) pain that: ? Gets worse. ? Stays in one area (localizes).  You have a rash.  You have a stiff neck.  You get angry or annoyed more easily than normal (irritability).  You are more sleepy than normal.  You have a harder time waking up than normal.  You feel: ? Weak. ? Dizzy. ? Very thirsty.  You have peed (urinated) only a small amount of very dark pee during 6-8 hours. Get help right away if:  You have symptoms of   very bad dehydration.  You cannot drink fluids without throwing up (vomiting).  Your symptoms get worse with treatment.  You have a fever.  You have a very bad headache.  You are throwing up or having watery poop (diarrhea) and it: ? Gets worse. ? Does not go away.  You have blood or something green (bile) in your throw-up.  You have blood in your poop (stool). This may cause poop to look black and tarry.  You have not peed in 6-8 hours.  You pass out (faint).  Your heart rate when you are sitting still is more than 100 beats a  minute.  You have trouble breathing. This information is not intended to replace advice given to you by your health care provider. Make sure you discuss any questions you have with your health care provider. Document Released: 04/14/2009 Document Revised: 01/06/2016 Document Reviewed: 08/12/2015 Elsevier Interactive Patient Education  2018 [employer]. Sinusitis, Adult Sinusitis is soreness and inflammation of your sinuses. Sinuses are hollow spaces in the bones around your face. They are located:  Around your eyes.  In the middle of your forehead.  Behind your nose.  In your cheekbones.  Your sinuses and nasal passages are lined with a stringy fluid (mucus). Mucus normally drains out of your sinuses. When your nasal tissues get inflamed or swollen, the mucus can get trapped or blocked so air cannot flow through your sinuses. This lets bacteria, viruses, and funguses grow, and that leads to infection. Follow these instructions at home: Medicines  Take, use, or apply over-the-counter and prescription medicines only as told by your doctor. These may include nasal sprays.  If you were prescribed an antibiotic medicine, take it as told by your doctor. Do not stop taking the antibiotic even if you start to feel better. Hydrate and Humidify  Drink enough water to keep your pee (urine) clear or pale yellow.  Use a cool mist humidifier to keep the humidity level in your home above 50%.  Breathe in steam for 10-15 minutes, 3-4 times a day or as told by your doctor. You can do this in the bathroom while a hot shower is running.  Try not to spend time in cool or dry air. Rest  Rest as much as possible.  Sleep with your head raised (elevated).  Make sure to get enough sleep each night. General instructions  Put a warm, moist washcloth on your face 3-4 times a day or as told by your doctor. This will help with discomfort.  Wash your hands often with soap and water. If there is no  soap and water, use hand sanitizer.  Do not smoke. Avoid being around people who are smoking (secondhand smoke).  Keep all follow-up visits as told by your doctor. This is important. Contact a doctor if:  You have a fever.  Your symptoms get worse.  Your symptoms do not get better within 10 days. Get help right away if:  You have a very bad headache.  You cannot stop throwing up (vomiting).  You have pain or swelling around your face or eyes.  You have trouble seeing.  You feel confused.  Your neck is stiff.  You have trouble breathing. This information is not intended to replace advice given to you by your health care provider. Make sure you discuss any questions you have with your health care provider. Document Released: 12/05/2007 Document Revised: 02/12/2016 Document Reviewed: 04/13/2015 Elsevier Interactive Patient Education  2018 [employer].  Otitis Media,  Adult Otitis media is redness, soreness, and puffiness (swelling) in the space just behind your eardrum (middle ear). It may be caused by allergies or infection. It often happens along with a cold. Follow these instructions at home:  Take your medicine as told. Finish it even if you start to feel better.  Only take over-the-counter or prescription medicines for pain, discomfort, or fever as told by your doctor.  Follow up with your doctor as told. Contact a doctor if:  You have otitis media only in one ear, or bleeding from your nose, or both.  You notice a lump on your neck.  You are not getting better in 3-5 days.  You feel worse instead of better. Get help right away if:  You have pain that is not helped with medicine.  You have puffiness, redness, or pain around your ear.  You get a stiff neck.  You cannot move part of your face (paralysis).  You notice that the bone behind your ear hurts when you touch it. This information is not intended to replace advice given to you by your health care  provider. Make sure you discuss any questions you have with your health care provider. Document Released: 12/05/2007 Document Revised: 11/24/2015 Document Reviewed: 01/13/2013 Elsevier Interactive Patient Education  2017 [employer]  AVOID SUDAFED and sudafed  cointaing products.  Hematuria, Adult Hematuria is blood in your urine. It can be caused by a bladder infection, kidney infection, prostate infection, kidney stone, or cancer of your urinary tract. Infections can usually be treated with medicine, and a kidney stone usually will pass through your urine. If neither of these is the cause of your hematuria, further workup to find out the reason may be needed. It is very important that you tell your health care provider about any blood you see in your urine, even if the blood stops without treatment or happens without causing pain. Blood in your urine that happens and then stops and then happens again can be a symptom of a very serious condition. Also, pain is not a symptom in the initial stages of many urinary cancers. Follow these instructions at home:  Drink lots of fluid, 3-4 quarts a day. If you have been diagnosed with an infection, cranberry juice is especially recommended, in addition to large amounts of water.  Avoid caffeine, tea, and carbonated beverages because they tend to irritate the bladder.  Avoid alcohol because it may irritate the prostate.  Take all medicines as directed by your health care provider.  If you were prescribed an antibiotic medicine, finish it all even if you start to feel better.  If you have been diagnosed with a kidney stone, follow your health care provider's instructions regarding straining your urine to catch the stone.  Empty your bladder often. Avoid holding urine for long periods of time Avoid Sudafed and Sudafed containing products .  After a bowel movement, women should cleanse front to back. Use each tissue only once.  Empty your bladder  before and after sexual intercourse if you are a male. Contact a health care provider if:  You develop back pain.  You have a fever.  You have a feeling of sickness in your stomach (nausea) or vomiting.  Your symptoms are not better in 3 days. Return sooner if you are getting worse. Get help right away if:  You develop severe vomiting and are unable to keep the medicine down.  You develop severe back or abdominal pain despite taking your medicines.  You begin passing a large amount of blood or clots in your urine.  You feel extremely weak or faint, or you pass out. This information is not intended to replace advice given to you by your health care provider. Make sure you discuss any questions you have with your health care provider. Document Released: 06/18/2005 Document Revised: 11/24/2015 Document Reviewed: 02/16/2013 Elsevier Interactive Patient Education  2017 [employer].

## 2017-10-31 ENCOUNTER — Encounter: Admission: RE | Payer: Self-pay | Source: Ambulatory Visit

## 2017-10-31 ENCOUNTER — Ambulatory Visit: Admission: RE | Admit: 2017-10-31 | Payer: BLUE CROSS/BLUE SHIELD | Source: Ambulatory Visit | Admitting: Urology

## 2017-10-31 LAB — CBC WITH DIFFERENTIAL/PLATELET
BASOS ABS: 0.1 10*3/uL (ref 0.0–0.2)
Basos: 1 %
EOS (ABSOLUTE): 0 10*3/uL (ref 0.0–0.4)
Eos: 0 %
Hematocrit: 44.3 % (ref 37.5–51.0)
Hemoglobin: 16 g/dL (ref 13.0–17.7)
Immature Grans (Abs): 0 10*3/uL (ref 0.0–0.1)
Immature Granulocytes: 0 %
LYMPHS ABS: 1 10*3/uL (ref 0.7–3.1)
Lymphs: 7 %
MCH: 31.9 pg (ref 26.6–33.0)
MCHC: 36.1 g/dL — AB (ref 31.5–35.7)
MCV: 88 fL (ref 79–97)
MONOS ABS: 1.4 10*3/uL — AB (ref 0.1–0.9)
Monocytes: 11 %
NEUTROS ABS: 11 10*3/uL — AB (ref 1.4–7.0)
Neutrophils: 81 %
PLATELETS: 221 10*3/uL (ref 150–379)
RBC: 5.02 x10E6/uL (ref 4.14–5.80)
RDW: 13.8 % (ref 12.3–15.4)
WBC: 13.5 10*3/uL — AB (ref 3.4–10.8)

## 2017-10-31 SURGERY — LITHOTRIPSY, ESWL
Anesthesia: Moderate Sedation | Laterality: Left

## 2017-10-31 NOTE — Progress Notes (Signed)
Patient was called with lab work results. He is currently on Amoxicillin 875 mg twice daily. Blood work shows elevated WBC,elevated neutrophils absolute, lymphocytes absolute elevated. He declined Augmentin. He reports he is feeling some better after two doses of Amoxicillin since office visit. He reports heart rate is normal today. Mild fever lower than in office yesterday. He reports sore throat continues, advised Motrin per package instructions, salt water gargles. He reports painful swallowing but no difficulty swallowing foods or liquids and no shortness of breath. He denies any urinary symptoms. Denies any pain.

## 2017-11-01 LAB — URINE CULTURE

## 2017-11-19 ENCOUNTER — Ambulatory Visit: Payer: BLUE CROSS/BLUE SHIELD | Admitting: Urology

## 2018-04-28 DIAGNOSIS — M25561 Pain in right knee: Secondary | ICD-10-CM | POA: Insufficient documentation

## 2018-12-01 HISTORY — PX: KNEE ARTHROSCOPY: SUR90

## 2019-07-27 DIAGNOSIS — L810 Postinflammatory hyperpigmentation: Secondary | ICD-10-CM | POA: Diagnosis not present

## 2019-07-27 DIAGNOSIS — D2230 Melanocytic nevi of unspecified part of face: Secondary | ICD-10-CM | POA: Diagnosis not present

## 2019-07-27 DIAGNOSIS — D1800 Hemangioma unspecified site: Secondary | ICD-10-CM | POA: Diagnosis not present

## 2019-07-27 DIAGNOSIS — D2260 Melanocytic nevi of unspecified upper limb, including shoulder: Secondary | ICD-10-CM | POA: Diagnosis not present

## 2019-08-22 DIAGNOSIS — Z20828 Contact with and (suspected) exposure to other viral communicable diseases: Secondary | ICD-10-CM | POA: Diagnosis not present

## 2020-04-26 ENCOUNTER — Encounter: Payer: Self-pay | Admitting: Emergency Medicine

## 2020-04-26 ENCOUNTER — Other Ambulatory Visit: Payer: Self-pay

## 2020-04-26 ENCOUNTER — Emergency Department
Admission: EM | Admit: 2020-04-26 | Discharge: 2020-04-26 | Disposition: A | Discharge status: Home / Self Care | Payer: BC Managed Care – PPO | Attending: Student in an Organized Health Care Education/Training Program | Admitting: Student in an Organized Health Care Education/Training Program

## 2020-04-26 ENCOUNTER — Emergency Department: Payer: BC Managed Care – PPO

## 2020-04-26 DIAGNOSIS — K5732 Diverticulitis of large intestine without perforation or abscess without bleeding: Secondary | ICD-10-CM | POA: Diagnosis not present

## 2020-04-26 DIAGNOSIS — Z5321 Procedure and treatment not carried out due to patient leaving prior to being seen by health care provider: Secondary | ICD-10-CM | POA: Insufficient documentation

## 2020-04-26 DIAGNOSIS — R110 Nausea: Secondary | ICD-10-CM | POA: Insufficient documentation

## 2020-04-26 DIAGNOSIS — Z87442 Personal history of urinary calculi: Secondary | ICD-10-CM | POA: Diagnosis not present

## 2020-04-26 DIAGNOSIS — R1032 Left lower quadrant pain: Secondary | ICD-10-CM | POA: Insufficient documentation

## 2020-04-26 DIAGNOSIS — N049 Nephrotic syndrome with unspecified morphologic changes: Secondary | ICD-10-CM | POA: Diagnosis not present

## 2020-04-26 DIAGNOSIS — K6389 Other specified diseases of intestine: Secondary | ICD-10-CM | POA: Diagnosis not present

## 2020-04-26 DIAGNOSIS — R350 Frequency of micturition: Secondary | ICD-10-CM | POA: Insufficient documentation

## 2020-04-26 LAB — URINALYSIS, COMPLETE (UACMP) WITH MICROSCOPIC
Bacteria, UA: NONE SEEN
Bilirubin Urine: NEGATIVE
Glucose, UA: NEGATIVE mg/dL
Hgb urine dipstick: NEGATIVE
Ketones, ur: NEGATIVE mg/dL
Leukocytes,Ua: NEGATIVE
Nitrite: NEGATIVE
Protein, ur: NEGATIVE mg/dL
Specific Gravity, Urine: 1.012 (ref 1.005–1.030)
pH: 7 (ref 5.0–8.0)

## 2020-04-26 LAB — BASIC METABOLIC PANEL
Anion gap: 9 (ref 5–15)
BUN: 13 mg/dL (ref 6–20)
CO2: 27 mmol/L (ref 22–32)
Calcium: 9.3 mg/dL (ref 8.9–10.3)
Chloride: 101 mmol/L (ref 98–111)
Creatinine, Ser: 1.04 mg/dL (ref 0.61–1.24)
GFR, Estimated: >60 mL/min (ref >60)
Glucose, Bld: 96 mg/dL (ref 70–99)
Potassium: 3.8 mmol/L (ref 3.5–5.1)
Sodium: 137 mmol/L (ref 135–145)

## 2020-04-26 LAB — CBC
HCT: 46 % (ref 39.0–52.0)
Hemoglobin: 16.1 g/dL (ref 13.0–17.0)
MCH: 31.8 pg (ref 26.0–34.0)
MCHC: 35 g/dL (ref 30.0–36.0)
MCV: 90.7 fL (ref 80.0–100.0)
Platelets: 209 10*3/uL (ref 150–400)
RBC: 5.07 MIL/uL (ref 4.22–5.81)
RDW: 13.7 % (ref 11.5–15.5)
WBC: 15.3 10*3/uL — ABNORMAL HIGH (ref 4.0–10.5)
nRBC: 0 % (ref 0.0–0.2)

## 2020-04-26 NOTE — ED Triage Notes (Signed)
Pt to ED from home c/o abd pain that started this morning.  Pt states LLQ pain radiating up to LUQ.  States hx of kidney stones and feels similar.  Nausea without vomiting.  Still urinating, foul odor, denies blood in urine.

## 2020-04-27 ENCOUNTER — Ambulatory Visit: Payer: BLUE CROSS/BLUE SHIELD | Admitting: Nurse Practitioner

## 2020-04-27 ENCOUNTER — Telehealth: Payer: Self-pay | Admitting: Medical

## 2020-04-27 ENCOUNTER — Encounter: Payer: Self-pay | Admitting: Nurse Practitioner

## 2020-04-27 VITALS — BP 142/94 | HR 122 | Temp 97.5°F | Resp 18 | Ht 70.0 in | Wt 217.0 lb

## 2020-04-27 DIAGNOSIS — K5792 Diverticulitis of intestine, part unspecified, without perforation or abscess without bleeding: Secondary | ICD-10-CM

## 2020-04-27 MED ORDER — METRONIDAZOLE 500 MG PO TABS: 500.0000 mg | ORAL_TABLET | Freq: Three times a day (TID) | ORAL | 0 refills | Status: AC

## 2020-04-27 MED ORDER — CIPROFLOXACIN HCL 500 MG PO TABS: 500.0000 mg | ORAL_TABLET | Freq: Two times a day (BID) | ORAL | 0 refills | Status: DC

## 2020-04-27 MED ORDER — TRAMADOL HCL 50 MG PO TABS
50.0000 mg | ORAL_TABLET | Freq: Three times a day (TID) | ORAL | 0 refills | Status: AC | PRN
Start: 2020-04-27 — End: 2020-05-02

## 2020-04-27 NOTE — Telephone Encounter (Signed)
I returned patient call after looking at his  Emergency Department Chart.  Dx: Diverticulitis uncomplicated And Recommendation of Colonoscopy due to other concerns.  Patient left prior to receiving antibiotics due to long wait times at [facility].  He would like medication and treatment. He still has pain and is currently taking Tylenol.  I called Dr. Sullivan Lone to review patient with him and plan..  The patient will need a physical exam prior to getting prescription medication.  Recommendation : Cipro 500mg  one po  twice dialy x 10 days   Metronidazole 500mg  one po three times a day x 10 days. Recheck next week., RT clinic if  Fever /chills, increased chest pain , blood in stool or any other concerns.  Dr agrees with plan.   Due to clinic schedule , FNP will evaluate patient. All information discussed with Sullivan Lone about patient.Jeremy Owens

## 2020-04-27 NOTE — Patient Instructions (Signed)
Medications as prescribed,   RTC within one week for follow up earlier as needed as discussed.   Seek immediate medical attention for new/worsening symptoms as discussed  Schedule physical with PCP  Colonoscopy in next 1-2 weeks

## 2020-04-27 NOTE — Progress Notes (Signed)
Subjective:    Patient ID: Jeremy Owens, male    DOB: Sep 26, 1975, 44 y.o.   MRN: [id number]  HPI 44 year old male, was in the ED yesterday with acute LLQ abdominal pain. This started suddenly, he has a history of kidney stones and believed that may have been the cause at the time. He had a CT in the ED that evidenced acute diverticulitis. HE went home before he was seen by a provider in the ED. Called this am to be seen at [facility] as he does not have a PCP.   He has not had a colonoscopy in the past.   He has been using tylenol for pain  Mother has a history of diverticulitis  He denies any preexisting GI complaints.   Today's Vitals   04/27/20 1410  BP: (!) 142/94  Pulse: (!) 122  Resp: 18  Temp: (!) 97.5 F (36.4 C)  TempSrc: Temporal  SpO2: 98%  Weight: 217 lb (98.4 kg)  Height: 5\' 10"  (1.778 m)   Body mass index is 31.14 kg/m.   Past Medical History:  Diagnosis Date  . Medical history non-contributory    Past Surgical History:  Procedure Laterality Date  . HARDWARE REMOVAL Right 2010  . KNEE ARTHROSCOPY Right 2017  . MEDIAL PARTIAL KNEE REPLACEMENT Right 2013  . ORIF  2010   right knee  . OTHER SURGICAL HISTORY     right elbow nerve decompression  . ULNAR NERVE TRANSPOSITION Right 05/15/2017   Procedure: Right elbow ulnar nerve decompression and/or transposition and nerve wrapping;  Surgeon: 05/17/2017, MD;  Location: [facility] OR;  Service: Orthopedics;  Laterality: Right;  90 mins    Review of Systems  Constitutional: Negative for chills and fever.  HENT: Negative.   Respiratory: Negative.   Cardiovascular: Negative.   Gastrointestinal: Positive for abdominal pain, diarrhea and nausea. Negative for abdominal distention, blood in stool, constipation, rectal pain and vomiting.  Endocrine: Negative.   Genitourinary: Negative.   Neurological: Negative.        Objective:   Physical Exam Constitutional:      Appearance: He is not toxic-appearing.   Cardiovascular:     Rate and Rhythm: Regular rhythm. Tachycardia present.  Pulmonary:     Effort: Pulmonary effort is normal.     Breath sounds: Normal breath sounds.  Abdominal:     General: Bowel sounds are normal. There is no distension.     Palpations: Abdomen is soft. There is no mass.     Tenderness: There is generalized abdominal tenderness and tenderness in the left lower quadrant. There is guarding. There is no right CVA tenderness or rebound.  Neurological:     Mental Status: He is alert.      Labs from ER as follows: Recent Results (from the past 2160 hour(s))  Urinalysis, Complete w Microscopic     Status: Abnormal   Collection Time: 04/26/20  7:54 PM  Result Value Ref Range   Color, Urine YELLOW (A) YELLOW   APPearance CLEAR (A) CLEAR   Specific Gravity, Urine 1.012 1.005 - 1.030   pH 7.0 5.0 - 8.0   Glucose, UA NEGATIVE NEGATIVE mg/dL   Hgb urine dipstick NEGATIVE NEGATIVE   Bilirubin Urine NEGATIVE NEGATIVE   Ketones, ur NEGATIVE NEGATIVE mg/dL   Protein, ur NEGATIVE NEGATIVE mg/dL   Nitrite NEGATIVE NEGATIVE   Leukocytes,Ua NEGATIVE NEGATIVE   RBC / HPF 0-5 0 - 5 RBC/hpf   WBC, UA 0-5 0 - 5 WBC/hpf  Bacteria, UA NONE SEEN NONE SEEN   Squamous Epithelial / LPF 0-5 0 - 5    Comment: Performed at [facility], [address] [place], [place] [address]  Basic metabolic panel     Status: None   Collection Time: 04/26/20  7:54 PM  Result Value Ref Range   Sodium 137 135 - 145 mmol/L   Potassium 3.8 3.5 - 5.1 mmol/L   Chloride 101 98 - 111 mmol/L   CO2 27 22 - 32 mmol/L   Glucose, Bld 96 70 - 99 mg/dL    Comment: Glucose reference range applies only to samples taken after fasting for at least 8 hours.   BUN 13 6 - 20 mg/dL   Creatinine, Ser 8.46 0.61 - 1.24 mg/dL   Calcium 9.3 8.9 - 65.9 mg/dL   GFR, Estimated >93 >57 mL/min    Comment: (NOTE) Calculated using the CKD-EPI Creatinine Equation (2021)    Anion gap 9 5 - 15    Comment: Performed  at [facility], [address] [place], [place] [address]  CBC     Status: Abnormal   Collection Time: 04/26/20  7:54 PM  Result Value Ref Range   WBC 15.3 (H) 4.0 - 10.5 K/uL   RBC 5.07 4.22 - 5.81 MIL/uL   Hemoglobin 16.1 13.0 - 17.0 g/dL   HCT 39.0 39 - 52 %   MCV 90.7 80.0 - 100.0 fL   MCH 31.8 26.0 - 34.0 pg   MCHC 35.0 30.0 - 36.0 g/dL   RDW 30.0 92.3 - 30.0 %   Platelets 209 150 - 400 K/uL   nRBC 0.0 0.0 - 0.2 %    Comment: Performed at [facility], [address], [place], [place] [address]    CT from ED: IMPRESSION: 1. Acute uncomplicated diverticulitis of the proximal sigmoid colon. No perforation or drainable abscess. Also questionable area of colonic wall thickening or distal to the inflamed diverticulum. Recommend follow-up colonoscopy after course of treatment to exclude possibility of underlying colonic neoplasm. 2. Small pericolonic and left common iliac nodes are likely reactive, but nonspecific. 3. No renal stones or obstructive uropathy. 4. Mild hepatic steatosis. 5. Bilateral L5 pars interarticularis defects without listhesis.   Assessment & Plan:  Discussed plan with Dr. Sullivan Lone. Medications as below, follow up within 1 week earlier if symptoms persist/worsen or change. BRAT/Bland diet. Maintain hydration as discussed. If experiencing blood in stools, acute fever, vomiting seek immediate medical attention as discussed. Patient will need colonoscopy after finishing antibiotics. Referral to GI placed. Also gave patient phone number to set up care with PCP, discussed the importance of scheduled health maintenance.   Meds ordered this encounter  Medications  . traMADol (ULTRAM) 50 MG tablet    Sig: Take 1 tablet (50 mg total) by mouth every 8 (eight) hours as needed for up to 5 days.    Dispense:  15 tablet    Refill:  0  . ciprofloxacin (CIPRO) 500 MG tablet    Sig: Take 1 tablet (500 mg total) by mouth 2 (two) times daily.     Dispense:  20 tablet    Refill:  0  . metroNIDAZOLE (FLAGYL) 500 MG tablet    Sig: Take 1 tablet (500 mg total) by mouth 3 (three) times daily for 10 days.    Dispense:  30 tablet    Refill:  0

## 2020-04-28 ENCOUNTER — Other Ambulatory Visit: Payer: Self-pay | Admitting: Nurse Practitioner

## 2020-04-28 DIAGNOSIS — K5792 Diverticulitis of intestine, part unspecified, without perforation or abscess without bleeding: Secondary | ICD-10-CM

## 2020-05-04 ENCOUNTER — Encounter: Payer: Self-pay | Admitting: Nurse Practitioner

## 2020-05-04 ENCOUNTER — Other Ambulatory Visit: Payer: Self-pay

## 2020-05-04 ENCOUNTER — Ambulatory Visit: Payer: BC Managed Care – PPO | Admitting: Nurse Practitioner

## 2020-05-04 VITALS — BP 122/76 | HR 97 | Temp 98.5°F | Resp 16

## 2020-05-04 DIAGNOSIS — K5792 Diverticulitis of intestine, part unspecified, without perforation or abscess without bleeding: Secondary | ICD-10-CM

## 2020-05-04 NOTE — Progress Notes (Signed)
   Subjective:    Patient ID: Jeremy Owens, male    DOB: [birth year], 44 y.o.   MRN: [id number]  HPI 44 year old male presents for follow up after treatment for diverticulitis. He was seen in clinic 04/28/20 and started on Cipro and Flagyl- continues this regimen today.   Has had a loss of appetite, but has been eating with antibiotic doses and "grazing" in place of eating routine meals.   Has had an increase in bowel movements associated with antibiotic use. On average 4+ bowel movements daily- loose. Denies any blood in stool. Denies any nausea or vomiting.   No longer requires pain medication, was able to return to work this week.   Has not heard from GI regarding referral for colonoscopy.   Today's Vitals   05/04/20 1410  BP: 122/76  Pulse: 97  Resp: 16  Temp: 98.5 F (36.9 C)  TempSrc: Oral  SpO2: 98%   There is no height or weight on file to calculate BMI.   Current Outpatient Medications  Medication Instructions  . ciprofloxacin (CIPRO) 500 mg, Oral, 2 times daily  . metroNIDAZOLE (FLAGYL) 500 mg, Oral, 3 times daily    Review of Systems  Constitutional: Negative.   HENT: Negative.   Gastrointestinal: Positive for diarrhea. Negative for abdominal distention, nausea and vomiting.       Objective:   Physical Exam Constitutional:      Appearance: Normal appearance.  Abdominal:     General: Bowel sounds are normal. There is no distension.     Palpations: Abdomen is soft.     Tenderness: There is abdominal tenderness.  Skin:    General: Skin is warm and dry.  Neurological:     Mental Status: He is alert and oriented to person, place, and time.  Psychiatric:        Mood and Affect: Mood normal.    Residual tenderness without guarding to LLQ improving since last exam         Assessment & Plan:  Finish antibiotic regimen as prescribed.   Will follow up with GI regarding need for colonoscopy following the completion of antibiotic regimen.   Advised  patient to also reach out to [place] if he has not heard from GI before this Friday.   RTC with any new or ongoing symptoms. Continue BRAT diet and maintain hydration as discussed.

## 2020-07-02 DIAGNOSIS — U071 COVID-19: Secondary | ICD-10-CM

## 2020-07-02 HISTORY — DX: COVID-19: U07.1

## 2020-10-04 DIAGNOSIS — Z1159 Encounter for screening for other viral diseases: Secondary | ICD-10-CM | POA: Diagnosis not present

## 2020-10-06 HISTORY — PX: KNEE ARTHROSCOPY: SUR90

## 2021-01-09 ENCOUNTER — Other Ambulatory Visit: Payer: Self-pay

## 2021-01-09 ENCOUNTER — Ambulatory Visit: Payer: BC Managed Care – PPO | Admitting: Medical

## 2021-01-09 VITALS — BP 140/88 | HR 82 | Temp 97.7°F | Resp 18

## 2021-01-09 DIAGNOSIS — Z87442 Personal history of urinary calculi: Secondary | ICD-10-CM

## 2021-01-09 DIAGNOSIS — R319 Hematuria, unspecified: Secondary | ICD-10-CM

## 2021-01-09 DIAGNOSIS — N390 Urinary tract infection, site not specified: Secondary | ICD-10-CM

## 2021-01-09 LAB — POCT URINALYSIS DIPSTICK
Bilirubin, UA: NEGATIVE
Glucose, UA: NEGATIVE
Ketones, UA: NEGATIVE
Nitrite, UA: NEGATIVE
Protein, UA: NEGATIVE
Spec Grav, UA: 1.01 (ref 1.010–1.025)
Urobilinogen, UA: 0.2 E.U./dL
pH, UA: 5 (ref 5.0–8.0)

## 2021-01-09 MED ORDER — CIPROFLOXACIN HCL 500 MG PO TABS: 500.0000 mg | ORAL_TABLET | Freq: Two times a day (BID) | ORAL | 0 refills | Status: DC

## 2021-01-09 NOTE — Progress Notes (Signed)
   Subjective:    Patient ID: Jeremy Owens, male    DOB: Oct 24, 1975, 45 y.o.   MRN: [id number]  HPI 45 yo male in non acute distress, here to have urine checked for infection. He thinks he has a kidney stone but wanted to have his urine checked for infection.  July  4th went swiimming  so this is why he is concerned about an infection. Firiday saw small amount of  blood urine. Denies any back or groin pain.   .  Allergies  Allergen Reactions   Sulfa Antibiotics Diarrhea    Review of Systems  Constitutional:  Negative for chills and fever.  Gastrointestinal:  Negative for abdominal pain.  Genitourinary:  Positive for frequency ("I am drinking alot of water"), hematuria, penile pain (sometimes with urination) and urgency. Negative for penile discharge.  Musculoskeletal:  Negative for back pain.     Patient not concerned about  Sexually transmitted infections. Objective:   Physical Exam NAD , No CVA tenderness Abdomen soft non tender, Some pressure suprapubic  Results for orders placed or performed in visit on 01/09/21 (from the past 24 hour(s))  POCT urinalysis dipstick     Status: Abnormal   Collection Time: 01/09/21  9:33 AM  Result Value Ref Range   Color, UA yellow    Clarity, UA clear    Glucose, UA Negative Negative   Bilirubin, UA Negative    Ketones, UA Negative    Spec Grav, UA 1.010 1.010 - 1.025   Blood, UA Moderate    pH, UA 5.0 5.0 - 8.0   Protein, UA Negative Negative   Urobilinogen, UA 0.2 0.2 or 1.0 E.U./dL   Nitrite, UA Negative    Leukocytes, UA Moderate (2+) (A) Negative   Appearance     Odor         Assessment & Plan:  UTI w/ hematuria H/O kidney stones. Sent off for C&S. If not improving in 3 days patient to contact our office. Continue to drink plenty of water. Meds ordered this encounter  Medications   ciprofloxacin (CIPRO) 500 MG tablet    Sig: Take 1 tablet (500 mg total) by mouth 2 (two) times daily.    Dispense:  14 tablet     Refill:  0   May use  AZO x  2 days if needed for spasm. Patient verbalizes understanding and has no questions at discharge.

## 2021-01-09 NOTE — Patient Instructions (Signed)
Acute Urinary Retention, Male °Acute urinary retention is when a person cannot pee (urinate) at all, or can only pee a little. This can come on all of a sudden. If it is not treated, it can lead to kidney problems or other serious problems. °What are the causes? °A problem with the tube that drains the bladder (urethra). °Problems with the nerves in the bladder. °Tumors. °Certain medicines. °An infection. °Having trouble pooping (constipation). °What increases the risk? °Older men are more at risk because their prostate gland may become larger as they age. Other conditions also can increase risk. These include: °Diseases, such as multiple sclerosis. °Injury to the spinal cord. °Diabetes. °A condition that affects the way the brain works, such as dementia. °Holding back urine due to trauma or because you do not want to use the bathroom. °What are the signs or symptoms? °Trouble peeing. °Pain in the lower belly. °How is this treated? °Treatment for this condition may include: °Medicines. °Placing a thin, germ-free tube (catheter) into the bladder to drain pee out of the body. °Therapy to treat mental health conditions. °Treatment for conditions that may cause this. °If needed, you may be treated in the hospital for kidney problems or to manage other problems. °Follow these instructions at home: °Medicines °Take over-the-counter and prescription medicines only as told by your doctor. Ask your doctor what medicines you should stay away from. °If you were given an antibiotic medicine, take it as told by your doctor. Do not stop taking it, even if you start to feel better. °General instructions °Do not smoke or use any products that contain nicotine or tobacco. If you need help quitting, ask your doctor. °Drink enough fluid to keep your pee pale yellow. °If you were sent home with a tube that drains the bladder, take care of it as told by your doctor. °Watch for changes in your symptoms. Tell your doctor about them. °If  told, keep track of changes in your blood pressure at home. Tell your doctor about them. °Keep all follow-up visits. °Contact a doctor if: °You have spasms in your bladder that you cannot stop. °You leak pee when you have spasms. °Get help right away if: °You have chills or a fever. °You have blood in your pee. °You have a tube that drains pee from the bladder and these things happen: °The tube stops draining pee. °The tube falls out. °Summary °Acute urinary retention is when you cannot pee at all or you pee too little. °If this condition is not treated, it can lead to kidney problems or other serious problems. °If you were sent home with a tube (catheter) that drains the bladder, take care of it as told by your doctor. °Watch for changes in your symptoms. Tell your doctor about them. °This information is not intended to replace advice given to you by your health care provider. Make sure you discuss any questions you have with your health care provider. °Document Revised: 03/09/2020 Document Reviewed: 03/09/2020 °Elsevier Patient Education © 2022 [employer] ° °

## 2021-01-12 ENCOUNTER — Other Ambulatory Visit: Payer: Self-pay

## 2021-01-12 ENCOUNTER — Encounter: Payer: Self-pay | Admitting: Physician Assistant

## 2021-01-12 ENCOUNTER — Telehealth: Payer: Self-pay | Admitting: Nurse Practitioner

## 2021-01-12 ENCOUNTER — Ambulatory Visit (INDEPENDENT_AMBULATORY_CARE_PROVIDER_SITE_OTHER): Payer: BC Managed Care – PPO | Admitting: Physician Assistant

## 2021-01-12 VITALS — BP 135/93 | HR 99 | Ht 72.0 in | Wt 215.0 lb

## 2021-01-12 DIAGNOSIS — R300 Dysuria: Secondary | ICD-10-CM

## 2021-01-12 DIAGNOSIS — R8290 Unspecified abnormal findings in urine: Secondary | ICD-10-CM | POA: Diagnosis not present

## 2021-01-12 DIAGNOSIS — R369 Urethral discharge, unspecified: Secondary | ICD-10-CM | POA: Diagnosis not present

## 2021-01-12 DIAGNOSIS — R3129 Other microscopic hematuria: Secondary | ICD-10-CM

## 2021-01-12 DIAGNOSIS — Z87442 Personal history of urinary calculi: Secondary | ICD-10-CM | POA: Insufficient documentation

## 2021-01-12 LAB — URINALYSIS, COMPLETE
Bilirubin, UA: NEGATIVE
Glucose, UA: NEGATIVE
Ketones, UA: NEGATIVE
Nitrite, UA: NEGATIVE
Specific Gravity, UA: 1.02 (ref 1.005–1.030)
Urobilinogen, Ur: 0.2 mg/dL (ref 0.2–1.0)
pH, UA: 5.5 (ref 5.0–7.5)

## 2021-01-12 LAB — BLADDER SCAN AMB NON-IMAGING

## 2021-01-12 LAB — MICROSCOPIC EXAMINATION
RBC, Urine: >30 /hpf — AB (ref 0–2)
WBC, UA: >30 /hpf — AB (ref 0–5)

## 2021-01-12 LAB — URINE CULTURE: Organism ID, Bacteria: NO GROWTH

## 2021-01-12 MED ORDER — TAMSULOSIN HCL 0.4 MG PO CAPS: 0.4000 mg | ORAL_CAPSULE | Freq: Every day | ORAL | 0 refills | Status: DC

## 2021-01-12 MED ORDER — CEFDINIR 300 MG PO CAPS: 300.0000 mg | ORAL_CAPSULE | Freq: Two times a day (BID) | ORAL | 0 refills | Status: DC

## 2021-01-12 NOTE — Telephone Encounter (Signed)
Patient called and stated his symptoms were improving initially on Cipro, and then returned today. He has increased frequency in urination, discomfort with urination and a sandy/mucous like discharge that is accompanied with his urine. Denies any risk for STI, is married and monogamous.   Denies any difficulty with urinary function.  Urine culture returned today without growth.   Will urgent refer to Urology for further workup.   Recent Results (from the past 2160 hour(s))  POCT urinalysis dipstick     Status: Abnormal   Collection Time: 01/09/21  9:33 AM  Result Value Ref Range   Color, UA yellow    Clarity, UA clear    Glucose, UA Negative Negative   Bilirubin, UA Negative    Ketones, UA Negative    Spec Grav, UA 1.010 1.010 - 1.025   Blood, UA Moderate    pH, UA 5.0 5.0 - 8.0   Protein, UA Negative Negative   Urobilinogen, UA 0.2 0.2 or 1.0 E.U./dL   Nitrite, UA Negative    Leukocytes, UA Moderate (2+) (A) Negative   Appearance     Odor    Urine Culture     Status: None   Collection Time: 01/09/21  9:45 AM   Specimen: Urine   Urine  Result Value Ref Range   Urine Culture, Routine Final report    Organism ID, Bacteria No growth

## 2021-01-12 NOTE — Progress Notes (Signed)
01/12/2021 2:59 PM   Roland Earl January 30, 1976 [id number]  CC: Chief Complaint  Patient presents with   Dysuria   Hematuria   HPI: Jeremy Owens is a 45 y.o. male with PMH nephrolithiasis and diverticulitis without perforation or abscess in 2021 who presents today for evaluation of possible UTI.  He is accompanied today by his wife, who contributes to HPI.  He saw his PCP 3 days ago for the same, at which point he reported 1 episode of gross hematuria that had since resolved.  He reported at that time having gone jet skiing over 4 July weekend and was unsure if this may be dislodged a kidney stone, which causes symptoms.  UA at that time notable for 2+ leukocyte esterase, though urine culture finalized with no growth.  He was started empirically on Cipro for management of possible UTI.    Today he reports his symptoms initially improved on Cipro, however they returned today and were accompanied by passage of sandy appearing debris as well as larger particles within the urine.  He believes he is passing air in his urine as well.  He describes both dysuria and pelvic pain/straining to pass the debris in his urine.  He denies fever, chills, and vomiting, but states he has been having some nausea.  He states he has been very anxious about this and is unsure if his nausea is related to that.    He underwent CT stone study on 04/26/2020 with no evidence of urolithiasis.  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  In-office UA today positive for 3+ blood, 1+ protein, and 3+ leukocyte esterase; urine microscopy with >30 WBCs/HPF, >30 RBCs/HPF, and moderate bacteria. PVR 65mL.  On my visual inspection of his urine, I note both small, dark particles approximately the size of a grain of sand as well as larger, flesh-colored pieces of debris.  PMH: Past Medical History:  Diagnosis Date   Diverticulitis    History of nephrolithiasis    Surgical  History: Past Surgical History:  Procedure Laterality Date   HARDWARE REMOVAL Right 2010   KNEE ARTHROSCOPY Right 2017   MEDIAL PARTIAL KNEE REPLACEMENT Right 2013   ORIF  2010   right knee   OTHER SURGICAL HISTORY     right elbow nerve decompression   ULNAR NERVE TRANSPOSITION Right 05/15/2017   Procedure: Right elbow ulnar nerve decompression and/or transposition and nerve wrapping;  Surgeon: Bradly Bienenstock, MD;  Location: [facility] OR;  Service: Orthopedics;  Laterality: Right;  90 mins    Home Medications:  Allergies as of 01/12/2021       Reactions   Sulfa Antibiotics Diarrhea        Medication List        Accurate as of January 12, 2021  2:59 PM. If you have any questions, ask your nurse or doctor.          ciprofloxacin 500 MG tablet Commonly known as: Cipro Take 1 tablet (500 mg total) by mouth 2 (two) times daily.        Allergies:  Allergies  Allergen Reactions   Sulfa Antibiotics Diarrhea    Family History: Family History  Problem Relation Age of Onset   Prostate cancer Neg Hx    Kidney cancer Neg Hx    Bladder Cancer Neg Hx     Social History:   reports that he has never smoked. His smokeless tobacco use includes chew.  He reports current alcohol use of about 4.0 standard drinks of alcohol per week. He reports that he does not use drugs.  Physical Exam: BP (!) 135/93   Pulse 99   Ht 6' (1.829 m)   Wt 215 lb (97.5 kg)   BMI 29.16 kg/m   Constitutional:  Alert and oriented, no acute distress, nontoxic appearing HEENT: [place], AT Cardiovascular: No clubbing, cyanosis, or edema Respiratory: Normal respiratory effort, no increased work of breathing Skin: No rashes, bruises or suspicious lesions Neurologic: Grossly intact, no focal deficits, moving all 4 extremities Psychiatric: Anxious mood and affect  Laboratory Data: Results for orders placed or performed in visit on 01/12/21  Microscopic Examination   Urine  Result Value Ref Range   WBC, UA >30  (A) 0 - 5 /hpf   RBC >30 (A) 0 - 2 /hpf   Epithelial Cells (non renal) 0-10 0 - 10 /hpf   Bacteria, UA Moderate (A) None seen/Few  Urinalysis, Complete  Result Value Ref Range   Specific Gravity, UA 1.020 1.005 - 1.030   pH, UA 5.5 5.0 - 7.5   Color, UA Yellow Yellow   Appearance Ur Cloudy (A) Clear   Leukocytes,UA 3+ (A) Negative   Protein,UA 1+ (A) Negative/Trace   Glucose, UA Negative Negative   Ketones, UA Negative Negative   RBC, UA 3+ (A) Negative   Bilirubin, UA Negative Negative   Urobilinogen, Ur 0.2 0.2 - 1.0 mg/dL   Nitrite, UA Negative Negative   Microscopic Examination See below:   Bladder Scan (Post Void Residual) in office  Result Value Ref Range   Scan Result 54mL    Assessment & Plan:   1. Abnormal urine findings Patient reports a 1 day history of debris in the urine as well as pneumaturia with a significant medical history of diverticulitis, nephrolithiasis, and tobacco use.  Differential includes colovesical fistula, bladder malignancy, and nephrolithiasis, though I have low suspicion for the latter given no evidence of stones on CT stone study less than 1 year ago.  I am starting him on tamsulosin to help him pass any residual debris within the bladder and recommend a CT urogram at this time for further evaluation.  Patient is in agreement with this plan.  We discussed return precautions today including fever, chills, and vomiting. - CT HEMATURIA WORKUP; Future - tamsulosin (FLOMAX) 0.4 MG CAPS capsule; Take 1 capsule (0.4 mg total) by mouth daily.  Dispense: 30 capsule; Refill: 0  2. Dysuria Likely related to #1 above, although notably his UA is grossly infected today greater than prior.  Counseled him to stop Cipro and switching him to cefdinir for broader coverage.  We will send urine for standard and atypical cultures as well as GC testing to rule out common infectious sources of dysuria. - Urinalysis, Complete - CULTURE, URINE COMPREHENSIVE - Ct Ng M  genitalium NAA, Urine - Mycoplasma / ureaplasma culture - Bladder Scan (Post Void Residual) in office - cefdinir (OMNICEF) 300 MG capsule; Take 1 capsule (300 mg total) by mouth 2 (two) times daily for 7 days.  Dispense: 14 capsule; Refill: 0   Return in about 1 week (around 01/19/2021) for Cysto and CTU results with Dr. Apolinar Junes.  Carman Ching, PA-C  [facility] [address], [address] [place], [place] [address] [phone]

## 2021-01-15 LAB — CULTURE, URINE COMPREHENSIVE

## 2021-01-16 ENCOUNTER — Other Ambulatory Visit: Payer: Self-pay

## 2021-01-16 ENCOUNTER — Ambulatory Visit
Admission: RE | Admit: 2021-01-16 | Discharge: 2021-01-16 | Disposition: A | Discharge status: Home / Self Care | Payer: BC Managed Care – PPO | Source: Ambulatory Visit | Attending: Physician Assistant | Admitting: Physician Assistant

## 2021-01-16 DIAGNOSIS — K3189 Other diseases of stomach and duodenum: Secondary | ICD-10-CM | POA: Diagnosis not present

## 2021-01-16 DIAGNOSIS — R319 Hematuria, unspecified: Secondary | ICD-10-CM | POA: Diagnosis not present

## 2021-01-16 DIAGNOSIS — R8290 Unspecified abnormal findings in urine: Secondary | ICD-10-CM | POA: Diagnosis not present

## 2021-01-16 DIAGNOSIS — K6389 Other specified diseases of intestine: Secondary | ICD-10-CM | POA: Diagnosis not present

## 2021-01-16 DIAGNOSIS — K5730 Diverticulosis of large intestine without perforation or abscess without bleeding: Secondary | ICD-10-CM | POA: Diagnosis not present

## 2021-01-16 MED ORDER — IOHEXOL 350 MG/ML SOLN
100.0000 mL | Freq: Once | INTRAVENOUS | Status: AC | PRN
  Administered 2021-01-16: 100 mL via INTRAVENOUS

## 2021-01-17 ENCOUNTER — Other Ambulatory Visit: Payer: Self-pay | Admitting: Physician Assistant

## 2021-01-17 DIAGNOSIS — R8290 Unspecified abnormal findings in urine: Secondary | ICD-10-CM

## 2021-01-17 LAB — CT NG M GENITALIUM NAA, URINE
Chlamydia trachomatis, NAA: NEGATIVE
Mycoplasma genitalium NAA: NEGATIVE
Neisseria gonorrhoeae, NAA: NEGATIVE

## 2021-01-17 MED ORDER — DIAZEPAM 2 MG PO TABS: ORAL_TABLET | ORAL | 0 refills | Status: DC

## 2021-01-17 NOTE — Progress Notes (Signed)
I just spoke with the patient via telephone to discuss the results of his CT urogram.  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  Patient remains on Omnicef as previously prescribed.  I counseled him to continue this medication and follow-up with Dr. Apolinar Junes tomorrow for cystoscopy as previously scheduled.  Patient remains extremely anxious regarding this diagnosis.  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  Patient requests medication to settle his nerves in advance of scheduled cystoscopy, which is reasonable.  I am prescribing 2 mg tablet of Valium to take 30 to 60 minutes prior.  I counseled the patient that he should have a driver tomorrow and not operate heavy machinery while under the influence of this medication.  He expressed understanding.

## 2021-01-18 ENCOUNTER — Ambulatory Visit: Payer: BC Managed Care – PPO | Admitting: Urology

## 2021-01-18 ENCOUNTER — Other Ambulatory Visit: Payer: Self-pay

## 2021-01-18 VITALS — BP 134/82 | HR 85 | Ht 72.0 in | Wt 215.0 lb

## 2021-01-18 DIAGNOSIS — N321 Vesicointestinal fistula: Secondary | ICD-10-CM | POA: Diagnosis not present

## 2021-01-18 DIAGNOSIS — K5792 Diverticulitis of intestine, part unspecified, without perforation or abscess without bleeding: Secondary | ICD-10-CM

## 2021-01-18 MED ORDER — METRONIDAZOLE 500 MG PO TABS: 500.0000 mg | ORAL_TABLET | Freq: Three times a day (TID) | ORAL | 0 refills | Status: AC

## 2021-01-18 MED ORDER — CIPROFLOXACIN HCL 500 MG PO TABS: 500.0000 mg | ORAL_TABLET | Freq: Two times a day (BID) | ORAL | 0 refills | Status: AC

## 2021-01-18 MED ORDER — DIAZEPAM 10 MG PO TABS: 10.0000 mg | ORAL_TABLET | Freq: Once | ORAL | 0 refills | Status: AC

## 2021-01-18 MED ORDER — ONDANSETRON 4 MG PO TBDP: 4.0000 mg | ORAL_TABLET | Freq: Three times a day (TID) | ORAL | 0 refills | Status: DC | PRN

## 2021-01-18 NOTE — Progress Notes (Signed)
   01/18/21  CC:  Chief Complaint  Patient presents with   Cysto    HPI: 45 year old male who presents today for cystoscopy.  He initially presented with hematuria and irritative urinary symptoms recently, is noted to have debris in his urine.  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  There is gas within the lumen of the bladder.  He is currently on Omnicef.  He has never previously had a colonoscopy.  He is extremely anxious today.  He is intermittently symptomatic.  He is passing debris in his urine.  No fevers.  Vitals:   01/18/21 1315  BP: 134/82  Pulse: 85   NED. A&Ox3.   No respiratory distress   Abd soft, NT, ND Normal phallus with bilateral descended testicles  Cystoscopy Procedure Note  Patient identification was confirmed, informed consent was obtained, and patient was prepped using Betadine solution.  Lidocaine jelly was administered per urethral meatus.     Pre-Procedure: - Inspection reveals a normal caliber ureteral meatus.  Procedure: The flexible cystoscope was introduced without difficulty - No urethral strictures/lesions are present. - Normal prostate  - Normal bladder neck - Bilateral ureteral orifices identified - Bladder mucosa  reveals fairly significant heaped up area, at least 2 cm on the posterior bladder wall with primarily bullous edema and debris debris, cannot rule out underlying mass.  The bladder is otherwise unremarkable without erythema.  Retroflexion shows unremarkable   Post-Procedure: - Patient tolerated the procedure well  Assessment/ Plan:  1. Colovesical fistula CT scan personally reviewed  Evidence of colovesical fistula on imaging and cystoscopically today  There is a fairly significant amount of bullous edema consistent with fistulous tract but this seems more profound than usual.  He is actively inflamed and has not been on the appropriate  antibiotics.  As such, we will switch him to Cipro Flagyl and try to cool him off.  We will plan to repeat cystoscopy in about 2 weeks.  If the lesion/degree of edema persists, we will proceed with bladder biopsy to rule out underlying malignancy  Case was personally discussed with Dr. Everlene Farrier has been referred to a general surgeon who will see him early next week.  He will also likely need a colonoscopy.  Warning symptoms reviewed with patient.  He was provided with a prescription for Zofran as well secondary to nausea  Work note was provided - Urinalysis, Complete - Ambulatory referral to General Surgery - Ambulatory referral to Gastroenterology  2. Diverticulitis See above  Cipro/Flagyl x2 weeks  Referral to gastroenterology - Ambulatory referral to General Surgery - Ambulatory referral to Gastroenterology  3. Nausea Zofran script    Vanna Scotland, MD  I spent 30 total minutes on the day of the encounter including pre-visit review of the medical record, face-to-face time with the patient, and post visit ordering of labs/imaging/tests.

## 2021-01-19 LAB — MYCOPLASMA / UREAPLASMA CULTURE
Mycoplasma hominis Culture: NEGATIVE
Ureaplasma urealyticum: NEGATIVE

## 2021-01-20 LAB — URINALYSIS, COMPLETE
Bilirubin, UA: NEGATIVE
Glucose, UA: NEGATIVE
Ketones, UA: NEGATIVE
Nitrite, UA: NEGATIVE
Protein,UA: NEGATIVE
Specific Gravity, UA: 1.01 (ref 1.005–1.030)
Urobilinogen, Ur: 0.2 mg/dL (ref 0.2–1.0)
pH, UA: 6.5 (ref 5.0–7.5)

## 2021-01-20 LAB — MICROSCOPIC EXAMINATION

## 2021-01-23 ENCOUNTER — Ambulatory Visit: Payer: BC Managed Care – PPO | Admitting: Surgery

## 2021-01-23 ENCOUNTER — Other Ambulatory Visit: Payer: Self-pay

## 2021-01-23 ENCOUNTER — Encounter: Payer: Self-pay | Admitting: Surgery

## 2021-01-23 VITALS — BP 144/92 | HR 88 | Temp 99.0°F | Ht 72.0 in | Wt 217.2 lb

## 2021-01-23 DIAGNOSIS — K5732 Diverticulitis of large intestine without perforation or abscess without bleeding: Secondary | ICD-10-CM

## 2021-01-23 DIAGNOSIS — N321 Vesicointestinal fistula: Secondary | ICD-10-CM | POA: Diagnosis not present

## 2021-01-23 MED ORDER — GABAPENTIN 300 MG PO CAPS: 300.0000 mg | ORAL_CAPSULE | Freq: Three times a day (TID) | ORAL | 0 refills | Status: DC

## 2021-01-23 MED ORDER — CYCLOBENZAPRINE HCL 5 MG PO TABS: 5.0000 mg | ORAL_TABLET | Freq: Three times a day (TID) | ORAL | 0 refills | Status: DC | PRN

## 2021-01-23 NOTE — Patient Instructions (Addendum)
A referral to [facility] has been placed. They will call you with an appointment.  If you have any concerns or questions, please feel free to call our office. See follow up appointment below.   Please pick up your prescriptions at your pharmacy.  Diverticulitis  Diverticulitis is when small pouches in your colon (large intestine) get infected or swollen. This causes pain in the belly (abdomen) and watery poop (diarrhea). These pouches are called diverticula. The pouches form in people who have acondition called diverticulosis. What are the causes? This condition may be caused by poop (stool) that gets trapped in the pouches in your colon. The poop lets germs (bacteria) grow in the pouches. This causes the infection. What increases the risk? You are more likely to get this condition if you have small pouches in your colon. The risk is higher if: You are overweight or very overweight (obese). You do not exercise enough. You drink alcohol. You smoke or use products with tobacco in them. You eat a diet that has a lot of red meat such as beef, pork, or lamb. You eat a diet that does not have enough fiber in it. You are older than 43 years of age. What are the signs or symptoms? Pain in the belly. Pain is often on the left side, but it may be in other areas. Fever and feeling cold. Feeling like you may vomit. Vomiting. Having cramps. Feeling full. Changes to how often you poop. Blood in your poop. How is this treated? Most cases are treated at home by: Taking over-the-counter pain medicines. Following a clear liquid diet. Taking antibiotic medicines. Resting. Very bad cases may need to be treated at a hospital. This may include: Not eating or drinking. Taking prescription pain medicine. Getting antibiotic medicines through an IV tube. Getting fluid and food through an IV tube. Having surgery. When you are feeling better, your doctor may tell you to have a test to check your colon  (colonoscopy). Follow these instructions at home: Medicines Take over-the-counter and prescription medicines only as told by your doctor. These include: Antibiotics. Pain medicines. Fiber pills. Probiotics. Stool softeners. If you were prescribed an antibiotic medicine, take it as told by your doctor. Do not stop taking the antibiotic even if you start to feel better. Ask your doctor if the medicine prescribed to you requires you to avoid driving or using machinery. Eating and drinking  Follow a diet as told by your doctor. When you feel better, your doctor may tell you to change your diet. You may need to eat a lot of fiber. Fiber makes it easier to poop (have a bowel movement). Foods with fiber include: Berries. Beans. Lentils. Green vegetables. Avoid eating red meat.  General instructions Do not use any products that contain nicotine or tobacco, such as cigarettes, e-cigarettes, and chewing tobacco. If you need help quitting, ask your doctor. Exercise 3 or more times a week. Try to get 30 minutes each time. Exercise enough to sweat and make your heart beat faster. Keep all follow-up visits as told by your doctor. This is important. Contact a doctor if: Your pain does not get better. You are not pooping like normal. Get help right away if: Your pain gets worse. Your symptoms do not get better. Your symptoms get worse very fast. You have a fever. You vomit more than one time. You have poop that is: Bloody. Black. Tarry. Summary This condition happens when small pouches in your colon get infected or swollen.  Take medicines only as told by your doctor. Follow a diet as told by your doctor. Keep all follow-up visits as told by your doctor. This is important. This information is not intended to replace advice given to you by your health care provider. Make sure you discuss any questions you have with your healthcare provider. Document Revised: 03/30/2019 Document Reviewed:  03/30/2019 Elsevier Patient Education  2022 [employer].

## 2021-01-23 NOTE — Progress Notes (Signed)
Patient ID: Jeremy Owens, male   DOB: 02/09/1976, 45 y.o.   MRN: [id number]  HPI Jeremy Owens is a 45 y.o. male seen in consultation at the request of Dr. Apolinar Junes for a colovesical fistula.  Case discussed with her in detail.  In summary he has been having intermittent suprapubic pain for the last few weeks in combination with burning with urination and recurrent UTIs.  He reports that the pain is in the suprapubic area is intermittent moderate intensity and sharp in nature.  No specific aggravating or elevating factors.  The hematuria and UTI work-up a CT scan was performed that I have personally reviewed showing evidence of diverticulitis with colovesical fistula.  There is no evidence of drainable collections.  There is no evidence of free air.  Also Dr. Apolinar Junes perform a cystoscopy showing evidence of the fistula.  He has been placed on ciprofloxacin and Flagyl with some improvement.  CBC from few months ago shows evidence of increased white count and BMP was completely normal.  He did have an episode of diverticulitis last year that prompted him and her emergency room visit and he was treated with antibiotics.  Family history of diverticulitis or colorectal cancer.  He has never had a colonoscopy in his life. Is an active cough and is able to perform more than 4 METS of activity without any shortness of breath or chest pain.  He has had multiple surgeries to include right knee surgeries and right elbow surgeries.  No prior abdominal operations.  UTI confirmed.  HPI  Past Medical History:  Diagnosis Date   Diverticulitis    History of nephrolithiasis     Past Surgical History:  Procedure Laterality Date   HARDWARE REMOVAL Right 2010   KNEE ARTHROSCOPY Right 2017   MEDIAL PARTIAL KNEE REPLACEMENT Right 2013   ORIF  2010   right knee   OTHER SURGICAL HISTORY     right elbow nerve decompression   ULNAR NERVE TRANSPOSITION Right 05/15/2017   Procedure: Right elbow ulnar nerve  decompression and/or transposition and nerve wrapping;  Surgeon: Bradly Bienenstock, MD;  Location: [facility];  Service: Orthopedics;  Laterality: Right;  90 mins    Family History  Problem Relation Age of Onset   Prostate cancer Neg Hx    Kidney cancer Neg Hx    Bladder Cancer Neg Hx     Social History Social History   Tobacco Use   Smoking status: Never   Smokeless tobacco: Current    Types: Chew  Substance Use Topics   Alcohol use: Yes    Alcohol/week: 4.0 standard drinks    Types: 4 Cans of beer per week   Drug use: No    Allergies  Allergen Reactions   Sulfa Antibiotics Diarrhea    Current Outpatient Medications  Medication Sig Dispense Refill   ciprofloxacin (CIPRO) 500 MG tablet Take 1 tablet (500 mg total) by mouth every 12 (twelve) hours for 14 days. 28 tablet 0   cyclobenzaprine (FLEXERIL) 5 MG tablet Take 1 tablet (5 mg total) by mouth 3 (three) times daily as needed for muscle spasms. 30 tablet 0   gabapentin (NEURONTIN) 300 MG capsule Take 1 capsule (300 mg total) by mouth 3 (three) times daily. 30 capsule 0   metroNIDAZOLE (FLAGYL) 500 MG tablet Take 1 tablet (500 mg total) by mouth 3 (three) times daily for 14 days. 42 tablet 0   ondansetron (ZOFRAN ODT) 4 MG disintegrating tablet Take 1 tablet (4 mg total)  by mouth every 8 (eight) hours as needed for nausea or vomiting. 20 tablet 0   tamsulosin (FLOMAX) 0.4 MG CAPS capsule Take 1 capsule (0.4 mg total) by mouth daily. 30 capsule 0   diazepam (VALIUM) 2 MG tablet Take one tablet by mouth 30-60 minutes prior to cystoscopy. Do not operate heavy machinery while on this medication. (Patient not taking: Reported on 01/23/2021) 1 tablet 0   No current facility-administered medications for this visit.     Review of Systems Full ROS  was asked and was negative except for the information on the HPI  Physical Exam Blood pressure (!) 144/92, pulse 88, temperature 99 F (37.2 C), temperature source Oral, height 6' (1.829 m),  weight 217 lb 3.2 oz (98.5 kg), SpO2 98 %. CONSTITUTIONAL: NAD EYES: Pupils are equal, round,  Sclera are non-icteric. EARS, NOSE, MOUTH AND THROAT: he is wearing a mask Hearing is intact to voice. LYMPH NODES:  Lymph nodes in the neck are normal. RESPIRATORY:  Lungs are clear. There is normal respiratory effort, with equal breath sounds bilaterally, and without pathologic use of accessory muscles. CARDIOVASCULAR: Heart is regular without murmurs, gallops, or rubs. GI: The abdomen is soft, mild tenderness palpation left lower quadrant without peritonitis, and nondistended. There are no palpable masses. There is no hepatosplenomegaly. There are normal bowel sounds in all quadrants. GU: Rectal deferred.   MUSCULOSKELETAL: Normal muscle strength and tone. No cyanosis or edema.   SKIN: Turgor is good and there are no pathologic skin lesions or ulcers. NEUROLOGIC: Motor and sensation is grossly normal. Cranial nerves are grossly intact. PSYCH:  Oriented to person, place and time. Affect is normal.  Data Reviewed  I have personally reviewed the patient's imaging, laboratory findings and medical records.    Assessment/Plan 45 year old male with with signs and symptoms consistent with a colovesical fistula.  Agree with continuation of antibiotic therapy.  We will arrange GI evaluation for colonoscopy.  She is to have a repeat cystoscopy next week.  I will see him back after he completes his colonoscopy.  At this point no need for emergent surgical intervention.  He does have significant spasms and some pain and I will prescribe a Flexeril as needed as well as gabapentin.  He wishes to avoid any narcotics if at all possible. I had an extensive discussion with him and his wife regarding his disease process.  They were asking about the type of surgery that we will do.  I had an extensive discussion with them about the surgery.  Risks, benefits and possible implications including but not limited to:  Bleeding, infection anastomotic leak.  They seem to understand they are very appreciative.  At this time does not need hospitalization or emergent surgical intervention at this time Time spent with this encounter  was 80 minutes, including personally reviewing images, documenting,  counseling and  coordination his care.   A copy of this report was sent to referring provider   Sterling Big, MD FACS General Surgeon 01/23/2021, 3:52 PM

## 2021-01-24 ENCOUNTER — Ambulatory Visit: Payer: BC Managed Care – PPO | Admitting: General Surgery

## 2021-01-25 ENCOUNTER — Other Ambulatory Visit: Payer: Self-pay | Admitting: Medical

## 2021-01-25 DIAGNOSIS — Z0000 Encounter for general adult medical examination without abnormal findings: Secondary | ICD-10-CM

## 2021-01-25 NOTE — Progress Notes (Signed)
I am ordering test for screening and to set him up with a PCP.

## 2021-01-26 ENCOUNTER — Telehealth: Payer: Self-pay | Admitting: Surgery

## 2021-01-26 NOTE — Telephone Encounter (Signed)
Patient is calling and said he was suppose to be having a colonoscopy done before he comes, said he wouldn't be able to see him until 03/13/21. Patient is asking what does he need to do. Please call patient and advise.

## 2021-01-26 NOTE — Telephone Encounter (Signed)
Patient calls office today to see if we could possibly get his colonoscopy done before 03/13/21 have sent Ginger a message asking her-waiting to hear back and then I will let the patient know.

## 2021-01-31 NOTE — H&P (View-Only) (Signed)
   02/01/2021  CC: No chief complaint on file.   HPI: Jeremy Owens is a 45 y.o. male who returns today for 2- week repeat cystoscopy.   Last visit 01/18/2021 CT scan showed evidence of colovesical fistula on imaging and cystoscopically. There was a  fairly significant amount of bullous edema consistent with fistulous tract but this seems more profound than usual.  He was actively inflamed and has not been on the appropriate antibiotics. He was switched to Cipro Flagyl.   Scheduled for colonscopy Friday.    Patients states he has experienced a few drops of blood in urine.   He was accompanied today by his wife.      Vitals:   02/01/21 1524  BP: 120/85  Pulse: (!) 111  NED. A&Ox3.   No respiratory distress   Abd soft, NT, ND Normal phallus with bilateral descended testicles  Cystoscopy Procedure Note  Patient identification was confirmed, informed consent was obtained, and patient was prepped using Betadine solution.  Lidocaine jelly was administered per urethral meatus.     Pre-Procedure: - Inspection reveals a normal caliber ureteral meatus.  Procedure: The flexible cystoscope was introduced without difficulty - No urethral strictures/lesions are present. - Normal prostate  - Normal bladder neck - Bilateral ureteral orifices identified - Bladder mucosa  reveals fairly significant heaped up area, at least 2 cm on the posterior bladder wall with primarily bullous edema and debris debris, cannot rule out underlying mass.  The bladder is otherwise unremarkable without erythema. - No bladder stones - No trabeculation  Retroflexion shows unremarkable   Post-Procedure: - Patient tolerated the procedure well  Assessment/ Plan:  Colovesical fistula  Evidence of colovesical fistula cystoscopically today   There is a fairly significant amount of bullous edema consistent with fistulous tract. He is actively inflamed and has been on the appropriate  antibiotics.  Recommend bladder biopsy to r/o underlying malignancy.  Risks of bleeding, infection, damage to bladder.  Diverticulitis  Cipro/Flagyl x2 weeks   Referral to gastroenterology - Ambulatory referral to General Surgery - Ambulatory referral to Gastroenterology  Follow-up wit bladder biopsy.  I,Kailey Littlejohn,acting as a Neurosurgeon for Vanna Scotland, MD.,have documented all relevant documentation on the behalf of Vanna Scotland, MD,as directed by  Vanna Scotland, MD while in the presence of Vanna Scotland, MD.  You have a ureteral stent in place.  This is a tube that extends from your kidney to your bladder.  This may cause urinary bleeding, burning with urination, and urinary frequency.  Please call our office or present to the ED if you develop fevers >101 or pain which is not able to be controlled with oral pain medications.  You may be given either Flomax and/ or ditropan to help with bladder spasms and stent pain in addition to pain medications.    [facility] [address] [place], [place] [address] [phone]

## 2021-01-31 NOTE — Progress Notes (Signed)
   02/01/2021  CC: No chief complaint on file.   HPI: Jeremy Owens is a 45 y.o. male who returns today for 2- week repeat cystoscopy.   Last visit 01/18/2021 CT scan showed evidence of colovesical fistula on imaging and cystoscopically. There was a  fairly significant amount of bullous edema consistent with fistulous tract but this seems more profound than usual.  He was actively inflamed and has not been on the appropriate antibiotics. He was switched to Cipro Flagyl.   Scheduled for colonscopy Friday.    Patients states he has experienced a few drops of blood in urine.   He was accompanied today by his wife.      Vitals:   02/01/21 1524  BP: 120/85  Pulse: (!) 111  NED. A&Ox3.   No respiratory distress   Abd soft, NT, ND Normal phallus with bilateral descended testicles  Cystoscopy Procedure Note  Patient identification was confirmed, informed consent was obtained, and patient was prepped using Betadine solution.  Lidocaine jelly was administered per urethral meatus.     Pre-Procedure: - Inspection reveals a normal caliber ureteral meatus.  Procedure: The flexible cystoscope was introduced without difficulty - No urethral strictures/lesions are present. - Normal prostate  - Normal bladder neck - Bilateral ureteral orifices identified - Bladder mucosa  reveals fairly significant heaped up area, at least 2 cm on the posterior bladder wall with primarily bullous edema and debris debris, cannot rule out underlying mass.  The bladder is otherwise unremarkable without erythema. - No bladder stones - No trabeculation  Retroflexion shows unremarkable   Post-Procedure: - Patient tolerated the procedure well  Assessment/ Plan:  Colovesical fistula  Evidence of colovesical fistula cystoscopically today   There is a fairly significant amount of bullous edema consistent with fistulous tract. He is actively inflamed and has been on the appropriate  antibiotics.  Recommend bladder biopsy to r/o underlying malignancy.  Risks of bleeding, infection, damage to bladder.  Diverticulitis  Cipro/Flagyl x2 weeks   Referral to gastroenterology - Ambulatory referral to General Surgery - Ambulatory referral to Gastroenterology  Follow-up wit bladder biopsy.  I,Kailey Littlejohn,acting as a scribe for Ashley Brandon, MD.,have documented all relevant documentation on the behalf of Ashley Brandon, MD,as directed by  Ashley Brandon, MD while in the presence of Ashley Brandon, MD.  You have a ureteral stent in place.  This is a tube that extends from your kidney to your bladder.  This may cause urinary bleeding, burning with urination, and urinary frequency.  Please call our office or present to the ED if you develop fevers >101 or pain which is not able to be controlled with oral pain medications.  You may be given either Flomax and/ or ditropan to help with bladder spasms and stent pain in addition to pain medications.    [facility] [address], [address] [place], [place] [address] [phone]   

## 2021-02-01 ENCOUNTER — Ambulatory Visit: Payer: BC Managed Care – PPO | Admitting: Urology

## 2021-02-01 ENCOUNTER — Other Ambulatory Visit: Payer: Self-pay

## 2021-02-01 VITALS — BP 120/85 | HR 111

## 2021-02-01 DIAGNOSIS — N321 Vesicointestinal fistula: Secondary | ICD-10-CM

## 2021-02-01 DIAGNOSIS — K5732 Diverticulitis of large intestine without perforation or abscess without bleeding: Secondary | ICD-10-CM

## 2021-02-01 MED ORDER — NA SULFATE-K SULFATE-MG SULF 17.5-3.13-1.6 GM/177ML PO SOLN: 1.0000 | ORAL | 0 refills | Status: DC

## 2021-02-02 ENCOUNTER — Other Ambulatory Visit: Payer: BC Managed Care – PPO

## 2021-02-02 ENCOUNTER — Telehealth: Payer: Self-pay | Admitting: *Deleted

## 2021-02-02 ENCOUNTER — Telehealth: Payer: Self-pay

## 2021-02-02 ENCOUNTER — Encounter: Payer: Self-pay | Admitting: Gastroenterology

## 2021-02-02 ENCOUNTER — Encounter
Admission: RE | Admit: 2021-02-02 | Discharge: 2021-02-02 | Disposition: A | Discharge status: Home / Self Care | Payer: BC Managed Care – PPO | Source: Ambulatory Visit | Attending: Urology | Admitting: Urology

## 2021-02-02 ENCOUNTER — Encounter: Payer: Self-pay | Admitting: Anesthesiology

## 2021-02-02 DIAGNOSIS — Z0000 Encounter for general adult medical examination without abnormal findings: Secondary | ICD-10-CM

## 2021-02-02 HISTORY — DX: Personal history of urinary calculi: Z87.442

## 2021-02-02 NOTE — Telephone Encounter (Signed)
Contacted patient and agreed to surgery date of 02/06/21 per Dr. Apolinar Junes for a Cysto bladder biopsy. Patient was given surgery instructions and phone reminder for PAT apt. Surgery time number was sent via mychart for patient.

## 2021-02-02 NOTE — Telephone Encounter (Signed)
Patient called and wanted to let us know what his colonoscopy that was scheduled tomorrow is canceled due to still being treated for diverticulitis. Patient is on cipro and wanted to know if he still needs to take it because that is one of the reasons why his colonoscopy was canceled. Please call and advise

## 2021-02-02 NOTE — Progress Notes (Signed)
Lab work

## 2021-02-02 NOTE — Patient Instructions (Addendum)
Your procedure is scheduled on: Monday, August 8 Report to the Registration Desk on the 1st floor of the [facility] To find out your arrival time, please call [phone] between 1PM - 3PM on: Friday, August 5  REMEMBER: Instructions that are not followed completely may result in serious medical risk, up to and including death; or upon the discretion of your surgeon and anesthesiologist your surgery may need to be rescheduled.  Do not eat or drink after midnight the night before surgery.  No gum chewing, lozengers or hard candies.  TAKE THESE MEDICATIONS THE MORNING OF SURGERY WITH A SIP OF WATER:   Tamsulosin (Flomax)  One week prior to surgery:  Stop Anti-inflammatories (NSAIDS) such as Advil, Aleve, Ibuprofen, Motrin, Naproxen, Naprosyn and Aspirin based products such as Excedrin, Goodys Powder, BC Powder. Stop ANY OVER THE COUNTER supplements until after surgery. You may however, continue to take Tylenol if needed for pain up until the day of surgery.  No Alcohol for 24 hours before or after surgery.  No Smoking including e-cigarettes for 24 hours prior to surgery.  No chewable tobacco products for at least 6 hours prior to surgery.  No nicotine patches on the day of surgery.  On the morning of surgery brush your teeth with toothpaste and water, you may rinse your mouth with mouthwash if you wish. Do not swallow any toothpaste or mouthwash.  Do not wear jewelry.  Do not wear lotions, powders, or perfumes.   Do not bring valuables to the hospital. [facility] is not responsible for any missing/lost belongings or valuables.   Notify your doctor if there is any change in your medical condition (cold, fever, infection).  Wear comfortable clothing (specific to your surgery type) to the hospital.  If you are being discharged the day of surgery, you will not be allowed to drive home. You will need a responsible adult (18 years or older) to drive you home and stay with you  that night.   If you are taking public transportation, you will need to have a responsible adult (18 years or older) with you. Please confirm with your physician that it is acceptable to use public transportation.   Please call the Pre-admissions Testing Dept. at [phone] if you have any questions about these instructions.  Surgery Visitation Policy:  Patients undergoing a surgery or procedure may have one family member or support person with them as long as that person is not COVID-19 positive or experiencing its symptoms.  That person may remain in the waiting area during the procedure.

## 2021-02-02 NOTE — Telephone Encounter (Signed)
Spoke with patient-he states Dr.Wohl wanted him to call to ask Dr.Pabon if he could stop the Cipro- he has an appointment 02/09/21 with Dr.Wohl to discuss Colonoscopy. I rescheduled patients appointment with Dr.Pabon 02/27/21. Patient to call office if colonoscopy can not be done before 02/27/21.

## 2021-02-03 ENCOUNTER — Ambulatory Visit
Admission: RE | Admit: 2021-02-03 | Payer: BC Managed Care – PPO | Source: Home / Self Care | Admitting: Gastroenterology

## 2021-02-03 LAB — CMP12+LP+TP+TSH+6AC+PSA+CBC…
ALT: 28 IU/L (ref 0–44)
AST: 23 IU/L (ref 0–40)
Albumin/Globulin Ratio: 1.4 (ref 1.2–2.2)
Albumin: 4.4 g/dL (ref 4.0–5.0)
Alkaline Phosphatase: 56 IU/L (ref 44–121)
BUN/Creatinine Ratio: 10 (ref 9–20)
BUN: 10 mg/dL (ref 6–24)
Basophils Absolute: 0 10*3/uL (ref 0.0–0.2)
Basos: 1 %
Bilirubin Total: 0.4 mg/dL (ref 0.0–1.2)
Calcium: 9.6 mg/dL (ref 8.7–10.2)
Chloride: 101 mmol/L (ref 96–106)
Chol/HDL Ratio: 3.4 ratio (ref 0.0–5.0)
Cholesterol, Total: 151 mg/dL (ref 100–199)
Creatinine, Ser: 1 mg/dL (ref 0.76–1.27)
EOS (ABSOLUTE): 0.1 10*3/uL (ref 0.0–0.4)
Eos: 1 %
Estimated CHD Risk: 0.5 times avg. (ref 0.0–1.0)
Free Thyroxine Index: 2.3 (ref 1.2–4.9)
GGT: 32 IU/L (ref 0–65)
Globulin, Total: 3.2 g/dL (ref 1.5–4.5)
Glucose: 98 mg/dL (ref 65–99)
HDL: 44 mg/dL (ref >39)
Hematocrit: 49.7 % (ref 37.5–51.0)
Hemoglobin: 16.8 g/dL (ref 13.0–17.7)
Immature Grans (Abs): 0 10*3/uL (ref 0.0–0.1)
Immature Granulocytes: 0 %
Iron: 105 ug/dL (ref 38–169)
LDH: 134 IU/L (ref 121–224)
LDL Chol Calc (NIH): 91 mg/dL (ref 0–99)
Lymphocytes Absolute: 1.3 10*3/uL (ref 0.7–3.1)
Lymphs: 31 %
MCH: 31.4 pg (ref 26.6–33.0)
MCHC: 33.8 g/dL (ref 31.5–35.7)
MCV: 93 fL (ref 79–97)
Monocytes Absolute: 0.4 10*3/uL (ref 0.1–0.9)
Monocytes: 10 %
Neutrophils Absolute: 2.5 10*3/uL (ref 1.4–7.0)
Neutrophils: 57 %
Phosphorus: 2.8 mg/dL (ref 2.8–4.1)
Platelets: 224 10*3/uL (ref 150–450)
Potassium: 4.4 mmol/L (ref 3.5–5.2)
Prostate Specific Ag, Serum: 2.2 ng/mL (ref 0.0–4.0)
RBC: 5.35 x10E6/uL (ref 4.14–5.80)
RDW: 12.6 % (ref 11.6–15.4)
Sodium: 138 mmol/L (ref 134–144)
T3 Uptake Ratio: 23 % — ABNORMAL LOW (ref 24–39)
T4, Total: 9.8 ug/dL (ref 4.5–12.0)
TSH: 1.99 u[IU]/mL (ref 0.450–4.500)
Total Protein: 7.6 g/dL (ref 6.0–8.5)
Triglycerides: 86 mg/dL (ref 0–149)
Uric Acid: 6.4 mg/dL (ref 3.8–8.4)
VLDL Cholesterol Cal: 16 mg/dL (ref 5–40)
WBC: 4.3 10*3/uL (ref 3.4–10.8)
eGFR: 96 mL/min/{1.73_m2} (ref >59)

## 2021-02-03 LAB — VITAMIN D 25 HYDROXY (VIT D DEFICIENCY, FRACTURES): Vit D, 25-Hydroxy: 25.1 ng/mL — ABNORMAL LOW (ref 30.0–100.0)

## 2021-02-03 LAB — HGB A1C W/O EAG: Hgb A1c MFr Bld: 5.1 % (ref 4.8–5.6)

## 2021-02-03 SURGERY — COLONOSCOPY WITH PROPOFOL
Anesthesia: Choice

## 2021-02-05 MED ORDER — METRONIDAZOLE 500 MG/100ML IV SOLN
500.0000 mg | Freq: Three times a day (TID) | INTRAVENOUS | Status: DC
  Administered 2021-02-06 (×2): 500 mg via INTRAVENOUS
  Filled 2021-02-05 (×5): qty 100

## 2021-02-05 MED ORDER — CIPROFLOXACIN IN D5W 400 MG/200ML IV SOLN: 400.0000 mg | INTRAVENOUS | Status: AC

## 2021-02-05 MED ORDER — FAMOTIDINE 20 MG PO TABS: 20.0000 mg | ORAL_TABLET | Freq: Once | ORAL | Status: AC

## 2021-02-05 MED ORDER — ORAL CARE MOUTH RINSE: 15.0000 mL | Freq: Once | OROMUCOSAL | Status: AC

## 2021-02-05 MED ORDER — LACTATED RINGERS IV SOLN: INTRAVENOUS | Status: DC

## 2021-02-05 MED ORDER — CEFAZOLIN SODIUM-DEXTROSE 2-4 GM/100ML-% IV SOLN
2.0000 g | INTRAVENOUS | Status: AC
  Administered 2021-02-06: 2 g via INTRAVENOUS

## 2021-02-05 MED ORDER — CHLORHEXIDINE GLUCONATE 0.12 % MT SOLN: 15.0000 mL | Freq: Once | OROMUCOSAL | Status: AC

## 2021-02-06 ENCOUNTER — Other Ambulatory Visit: Payer: Self-pay

## 2021-02-06 ENCOUNTER — Encounter: Payer: Self-pay | Admitting: Urology

## 2021-02-06 ENCOUNTER — Ambulatory Visit: Payer: BC Managed Care – PPO

## 2021-02-06 ENCOUNTER — Ambulatory Visit: Payer: BC Managed Care – PPO | Admitting: Certified Registered"

## 2021-02-06 ENCOUNTER — Encounter
Admission: RE | Disposition: A | Discharge status: Home / Self Care | Payer: Self-pay | Source: Home / Self Care | Attending: Urology

## 2021-02-06 ENCOUNTER — Ambulatory Visit
Admission: RE | Admit: 2021-02-06 | Discharge: 2021-02-06 | Disposition: A | Discharge status: Home / Self Care | Payer: BC Managed Care – PPO | Attending: Urology | Admitting: Urology

## 2021-02-06 DIAGNOSIS — R319 Hematuria, unspecified: Secondary | ICD-10-CM | POA: Diagnosis not present

## 2021-02-06 DIAGNOSIS — N3081 Other cystitis with hematuria: Secondary | ICD-10-CM | POA: Diagnosis not present

## 2021-02-06 DIAGNOSIS — N3080 Other cystitis without hematuria: Secondary | ICD-10-CM | POA: Diagnosis not present

## 2021-02-06 DIAGNOSIS — N321 Vesicointestinal fistula: Secondary | ICD-10-CM

## 2021-02-06 DIAGNOSIS — N3091 Cystitis, unspecified with hematuria: Secondary | ICD-10-CM | POA: Diagnosis not present

## 2021-02-06 HISTORY — PX: CYSTOSCOPY WITH BIOPSY: SHX5122

## 2021-02-06 SURGERY — CYSTOSCOPY, WITH BIOPSY
Anesthesia: General | Site: Bladder

## 2021-02-06 MED ORDER — DEXAMETHASONE SODIUM PHOSPHATE 10 MG/ML IJ SOLN
INTRAMUSCULAR | Status: AC
  Filled 2021-02-06: qty 1

## 2021-02-06 MED ORDER — FENTANYL CITRATE (PF) 100 MCG/2ML IJ SOLN
INTRAMUSCULAR | Status: AC
  Filled 2021-02-06: qty 2

## 2021-02-06 MED ORDER — ONDANSETRON HCL 4 MG/2ML IJ SOLN
INTRAMUSCULAR | Status: DC | PRN
  Administered 2021-02-06: 4 mg via INTRAVENOUS

## 2021-02-06 MED ORDER — PROPOFOL 10 MG/ML IV BOLUS
INTRAVENOUS | Status: DC | PRN
  Administered 2021-02-06: 200 mg via INTRAVENOUS

## 2021-02-06 MED ORDER — GLYCOPYRROLATE 0.2 MG/ML IJ SOLN
INTRAMUSCULAR | Status: AC
  Filled 2021-02-06: qty 1

## 2021-02-06 MED ORDER — FAMOTIDINE 20 MG PO TABS
ORAL_TABLET | ORAL | Status: AC
  Administered 2021-02-06: 20 mg via ORAL
  Filled 2021-02-06: qty 1

## 2021-02-06 MED ORDER — CHLORHEXIDINE GLUCONATE 0.12 % MT SOLN
OROMUCOSAL | Status: AC
  Administered 2021-02-06: 15 mL via OROMUCOSAL
  Filled 2021-02-06: qty 15

## 2021-02-06 MED ORDER — MIDAZOLAM HCL 2 MG/2ML IJ SOLN
INTRAMUSCULAR | Status: DC | PRN
  Administered 2021-02-06: 2 mg via INTRAVENOUS

## 2021-02-06 MED ORDER — LIDOCAINE HCL (CARDIAC) PF 100 MG/5ML IV SOSY
PREFILLED_SYRINGE | INTRAVENOUS | Status: DC | PRN
  Administered 2021-02-06: 100 mg via INTRAVENOUS

## 2021-02-06 MED ORDER — LACTATED RINGERS IV SOLN: INTRAVENOUS | Status: DC | PRN

## 2021-02-06 MED ORDER — GLYCOPYRROLATE 0.2 MG/ML IJ SOLN
INTRAMUSCULAR | Status: DC | PRN
  Administered 2021-02-06: .2 mg via INTRAVENOUS

## 2021-02-06 MED ORDER — FENTANYL CITRATE (PF) 100 MCG/2ML IJ SOLN: 25.0000 ug | INTRAMUSCULAR | Status: DC | PRN

## 2021-02-06 MED ORDER — MIDAZOLAM HCL 2 MG/2ML IJ SOLN
INTRAMUSCULAR | Status: AC
  Filled 2021-02-06: qty 2

## 2021-02-06 MED ORDER — CIPROFLOXACIN IN D5W 400 MG/200ML IV SOLN
INTRAVENOUS | Status: AC
  Administered 2021-02-06: 400 mg via INTRAVENOUS
  Filled 2021-02-06: qty 200

## 2021-02-06 MED ORDER — PROPOFOL 10 MG/ML IV BOLUS
INTRAVENOUS | Status: AC
  Filled 2021-02-06: qty 20

## 2021-02-06 MED ORDER — FENTANYL CITRATE (PF) 100 MCG/2ML IJ SOLN
INTRAMUSCULAR | Status: DC | PRN
  Administered 2021-02-06 (×2): 25 ug via INTRAVENOUS
  Administered 2021-02-06: 50 ug via INTRAVENOUS

## 2021-02-06 MED ORDER — ONDANSETRON HCL 4 MG/2ML IJ SOLN
INTRAMUSCULAR | Status: AC
  Filled 2021-02-06: qty 2

## 2021-02-06 MED ORDER — DEXMEDETOMIDINE (PRECEDEX) IN NS 20 MCG/5ML (4 MCG/ML) IV SYRINGE
PREFILLED_SYRINGE | INTRAVENOUS | Status: DC | PRN
  Administered 2021-02-06 (×2): 8 ug via INTRAVENOUS

## 2021-02-06 MED ORDER — OXYCODONE HCL 5 MG PO TABS
ORAL_TABLET | ORAL | Status: AC
  Filled 2021-02-06: qty 1

## 2021-02-06 MED ORDER — STERILE WATER FOR IRRIGATION IR SOLN
Status: DC | PRN
  Administered 2021-02-06: 3000 mL

## 2021-02-06 MED ORDER — OXYCODONE HCL 5 MG/5ML PO SOLN
5.0000 mg | Freq: Once | ORAL | Status: AC | PRN
Start: 2021-02-06 — End: 2021-02-06

## 2021-02-06 MED ORDER — OXYCODONE HCL 5 MG PO TABS
5.0000 mg | ORAL_TABLET | Freq: Once | ORAL | Status: AC | PRN
  Administered 2021-02-06: 5 mg via ORAL

## 2021-02-06 MED ORDER — 0.9 % SODIUM CHLORIDE (POUR BTL) OPTIME
TOPICAL | Status: DC | PRN
  Administered 2021-02-06: 500 mL

## 2021-02-06 MED ORDER — CEFAZOLIN SODIUM-DEXTROSE 2-4 GM/100ML-% IV SOLN
INTRAVENOUS | Status: AC
  Filled 2021-02-06: qty 100

## 2021-02-06 MED ORDER — DEXAMETHASONE SODIUM PHOSPHATE 10 MG/ML IJ SOLN
INTRAMUSCULAR | Status: DC | PRN
  Administered 2021-02-06: 10 mg via INTRAVENOUS

## 2021-02-06 SURGICAL SUPPLY — 20 items
BAG DRAIN CYSTO-URO LG1000N (MISCELLANEOUS) ×2 IMPLANT
BRUSH SCRUB EZ  4% CHG (MISCELLANEOUS) ×2
BRUSH SCRUB EZ 4% CHG (MISCELLANEOUS) ×1 IMPLANT
DRSG TELFA 4X3 1S NADH ST (GAUZE/BANDAGES/DRESSINGS) ×2 IMPLANT
ELECT REM PT RETURN 9FT ADLT (ELECTROSURGICAL) ×2
ELECTRODE REM PT RTRN 9FT ADLT (ELECTROSURGICAL) ×1 IMPLANT
GAUZE 4X4 16PLY ~~LOC~~+RFID DBL (SPONGE) ×4 IMPLANT
GLOVE SURG ENC MOIS LTX SZ6.5 (GLOVE) ×2 IMPLANT
GLOVE SURG SYN 6.5 ES PF (GLOVE) ×2 IMPLANT
GLOVE SURG SYN 6.5 PF PI (GLOVE) IMPLANT
GOWN STRL REUS W/ TWL LRG LVL3 (GOWN DISPOSABLE) ×2 IMPLANT
GOWN STRL REUS W/TWL LRG LVL3 (GOWN DISPOSABLE) ×4
KIT TURNOVER CYSTO (KITS) ×2 IMPLANT
NDL SAFETY ECLIPSE 18X1.5 (NEEDLE) ×1 IMPLANT
NEEDLE HYPO 18GX1.5 SHARP (NEEDLE) ×2
PACK CYSTO AR (MISCELLANEOUS) ×2 IMPLANT
SET CYSTO W/LG BORE CLAMP LF (SET/KITS/TRAYS/PACK) ×2 IMPLANT
SURGILUBE 2OZ TUBE FLIPTOP (MISCELLANEOUS) ×2 IMPLANT
WATER STERILE IRR 1000ML POUR (IV SOLUTION) ×2 IMPLANT
WATER STERILE IRR 3000ML UROMA (IV SOLUTION) ×2 IMPLANT

## 2021-02-06 NOTE — Discharge Instructions (Addendum)

## 2021-02-06 NOTE — Anesthesia Postprocedure Evaluation (Signed)
Anesthesia Post Note  Patient: Jeremy Owens  Procedure(s) Performed: CYSTOSCOPY WITH BLADDER BIOPSY (Bladder)  Patient location during evaluation: PACU Anesthesia Type: General Level of consciousness: awake and alert Pain management: pain level controlled Vital Signs Assessment: post-procedure vital signs reviewed and stable Respiratory status: spontaneous breathing, nonlabored ventilation, respiratory function stable and patient connected to nasal cannula oxygen Cardiovascular status: blood pressure returned to baseline and stable Postop Assessment: no apparent nausea or vomiting Anesthetic complications: no   No notable events documented.   Last Vitals:  Vitals:   02/06/21 1645 02/06/21 1650  BP: 103/78 109/79  Pulse: 92 89  Resp: 10 12  Temp:  (!) 36.2 C  SpO2: 95% 94%    Last Pain:  Vitals:   02/06/21 1650  TempSrc: Temporal  PainSc: 5                  Cleda Mccreedy Munachimso Rigdon

## 2021-02-06 NOTE — Anesthesia Preprocedure Evaluation (Signed)
Anesthesia Evaluation  Patient identified by MRN, date of birth, ID band Patient awake    Reviewed: Allergy & Precautions, NPO status , Patient's Chart, lab work & pertinent test results  History of Anesthesia Complications Negative for: history of anesthetic complications  Airway Mallampati: III  TM Distance: <3 FB Neck ROM: full    Dental  (+) Chipped   Pulmonary neg pulmonary ROS, neg shortness of breath,    Pulmonary exam normal        Cardiovascular Exercise Tolerance: Good (-) angina(-) Past MI and (-) DOE negative cardio ROS Normal cardiovascular exam     Neuro/Psych negative neurological ROS  negative psych ROS   GI/Hepatic negative GI ROS, Neg liver ROS, neg GERD  ,  Endo/Other  negative endocrine ROS  Renal/GU      Musculoskeletal  (+) Arthritis ,   Abdominal   Peds  Hematology negative hematology ROS (+)   Anesthesia Other Findings Past Medical History: No date: Diverticulitis No date: History of kidney stones No date: History of nephrolithiasis  Past Surgical History: 2011: HARDWARE REMOVAL; Right 2017: KNEE ARTHROSCOPY; Right     Comment:  clean out meniscus 2015: KNEE ARTHROSCOPY; Right     Comment:  cleaned out meniscus No date: KNEE ARTHROSCOPY; Right     Comment:  clean out meniscus 12/2018: KNEE ARTHROSCOPY; Right     Comment:  replaced broken hardware 10/06/2020: KNEE ARTHROSCOPY; Right     Comment:  partial medial meniscectomy 2013: MEDIAL PARTIAL KNEE REPLACEMENT; Right 2010: ORIF     Comment:  right knee; injury from work 1996: OTHER SURGICAL HISTORY; Right     Comment:  right elbow nerve decompression 05/15/2017: ULNAR NERVE TRANSPOSITION; Right     Comment:  Procedure: Right elbow ulnar nerve decompression and/or               transposition and nerve wrapping;  Surgeon: Bradly Bienenstock, MD;  Location: [facility];  Service: Orthopedics;                Laterality:  Right;  90 mins     Reproductive/Obstetrics negative OB ROS                             Anesthesia Physical Anesthesia Plan  ASA: 2  Anesthesia Plan: General LMA   Post-op Pain Management:    Induction: Intravenous  PONV Risk Score and Plan: Dexamethasone, Ondansetron, Midazolam and Treatment may vary due to age or medical condition  Airway Management Planned: LMA  Additional Equipment:   Intra-op Plan:   Post-operative Plan: Extubation in OR  Informed Consent: I have reviewed the patients History and Physical, chart, labs and discussed the procedure including the risks, benefits and alternatives for the proposed anesthesia with the patient or authorized representative who has indicated his/her understanding and acceptance.     Dental Advisory Given  Plan Discussed with: Anesthesiologist, CRNA and Surgeon  Anesthesia Plan Comments: (Patient consented for risks of anesthesia including but not limited to:  - adverse reactions to medications - damage to eyes, teeth, lips or other oral mucosa - nerve damage due to positioning  - sore throat or hoarseness - Damage to heart, brain, nerves, lungs, other parts of body or loss of life  Patient voiced understanding.)        Anesthesia Quick Evaluation

## 2021-02-06 NOTE — Interval H&P Note (Signed)
History and Physical Interval Note:  02/06/2021 3:26 PM  Jeremy Owens  has presented today for surgery, with the diagnosis of Colovesical Fistula.  The various methods of treatment have been discussed with the patient and family. After consideration of risks, benefits and other options for treatment, the patient has consented to  Procedure(s): CYSTOSCOPY WITH BLADDER BIOPSY (N/A) as a surgical intervention.  The patient's history has been reviewed, patient examined, no change in status, stable for surgery.  I have reviewed the patient's chart and labs.  Questions were answered to the patient's satisfaction.    RRR CTAB  Vanna Scotland

## 2021-02-06 NOTE — Anesthesia Procedure Notes (Signed)
Procedure Name: LMA Insertion Date/Time: 02/06/2021 3:50 PM Performed by: Irving Burton, CRNA Pre-anesthesia Checklist: Patient identified, Patient being monitored, Timeout performed, Emergency Drugs available and Suction available Patient Re-evaluated:Patient Re-evaluated prior to induction Oxygen Delivery Method: Circle system utilized Preoxygenation: Pre-oxygenation with 100% oxygen Induction Type: IV induction Ventilation: Mask ventilation without difficulty LMA: LMA inserted LMA Size: 4.5 Tube type: Oral Number of attempts: 1 Placement Confirmation: positive ETCO2 and breath sounds checked- equal and bilateral Tube secured with: Tape Dental Injury: Teeth and Oropharynx as per pre-operative assessment

## 2021-02-06 NOTE — Op Note (Signed)
Date of procedure: 02/06/21   Preoperative diagnosis:  Colovesical fistula centimeters   Postoperative diagnosis:  Same as above   Procedure: Cystoscopy with bladder biopsy   Surgeon: Vanna Scotland, MD   Anesthesia: General   Complications: None   Intraoperative findings: Heaped up area with bullous edema and some surrounding erythema, approximately 2 cm on the posterior wall towards the dome.  2 representative biopsies performed, hemostasis good.   EBL: Minimal   Specimens: Bladder biopsy   Drains: None   Indication: Jeremy Expose is a 45 y.o. patient with diverticulitis who subsequently developed colovesical fistula.  In the office, he had a raised area probably related to inflammation but unable to rule out neoplasm.  After reviewing the management options for treatment, he elected to proceed with the above surgical procedure(s). We have discussed the potential benefits and risks of the procedure, side effects of the proposed treatment, the likelihood of the patient achieving the goals of the procedure, and any potential problems that might occur during the procedure or recuperation. Informed consent has been obtained.   Description of procedure:   The patient was taken to the operating room and general anesthesia was induced.  The patient was placed in the dorsal lithotomy position, prepped and draped in the usual sterile fashion, and preoperative antibiotics were administered. A preoperative time-out was performed.   21 [place] scope was advanced per urethra to the bladder.  The bladder was carefully inspected.  There is area of bullous edema on the posterior bladder wall heading up towards the dome, approximately 2 cm in size with some surrounding erythema.  It was significantly raised in appearance primarily bullous and hypervascular.  Also debris throughout the bladder with some mild erythema.  Cold cup biopsy forceps were used to biopsy the raised area x2.  This was passed  off the field as bladder biopsy.  Bugbee electrocautery using water as the medium was used to fulgurate the area and hemostasis was good.  The patient was then cleaned and dried, repositioned in supine position, reversed myesthesia, taken to PACU in stable condition.  Plan: I will call him with the pathology results.   Vanna Scotland, M.D.

## 2021-02-06 NOTE — Transfer of Care (Signed)
Immediate Anesthesia Transfer of Care Note  Patient: Jeremy Owens  Procedure(s) Performed: CYSTOSCOPY WITH BLADDER BIOPSY (Bladder)  Patient Location: PACU  Anesthesia Type:General  Level of Consciousness: sedated  Airway & Oxygen Therapy: Patient Spontanous Breathing and Patient connected to face mask oxygen  Post-op Assessment: Report given to RN and Post -op Vital signs reviewed and stable  Post vital signs: Reviewed and stable  Last Vitals:  Vitals Value Taken Time  BP 101/72 02/06/21 1616  Temp    Pulse 95 02/06/21 1618  Resp 15 02/06/21 1618  SpO2 94 % 02/06/21 1618  Vitals shown include unvalidated device data.  Last Pain:  Vitals:   02/06/21 1420  TempSrc: Temporal  PainSc: 0-No pain         Complications: No notable events documented.

## 2021-02-07 ENCOUNTER — Encounter: Payer: Self-pay | Admitting: Urology

## 2021-02-08 ENCOUNTER — Telehealth: Payer: Self-pay | Admitting: *Deleted

## 2021-02-08 LAB — SURGICAL PATHOLOGY

## 2021-02-08 NOTE — Telephone Encounter (Addendum)
Patient informed, voiced understanding.    ----- Message from Vanna Scotland, MD sent at 02/08/2021 11:45 AM EDT ----- Inflammatory changes of bladder, no cancer!  Great news.  Vanna Scotland, MD

## 2021-02-09 ENCOUNTER — Other Ambulatory Visit: Payer: Self-pay

## 2021-02-09 ENCOUNTER — Encounter: Payer: Self-pay | Admitting: Gastroenterology

## 2021-02-09 ENCOUNTER — Ambulatory Visit: Payer: BC Managed Care – PPO | Admitting: Gastroenterology

## 2021-02-09 VITALS — BP 121/78 | HR 101 | Ht 72.0 in | Wt 214.6 lb

## 2021-02-09 DIAGNOSIS — K5732 Diverticulitis of large intestine without perforation or abscess without bleeding: Secondary | ICD-10-CM | POA: Diagnosis not present

## 2021-02-09 NOTE — H&P (View-Only) (Signed)
Gastroenterology Consultation  Referring Provider:     Marcy Salvo* Primary Care Physician:  Talmage Nap, PA-C Primary Gastroenterologist:  Dr. Allen Norris     Reason for Consultation:     Diverticulitis        HPI:   Jeremy Owens is a 45 y.o. y/o male referred for consultation & management of diverticulitis by Dr. Lily Peer, Estill Dooms, PA-C.  This patient comes in to see me after previously being set up for a colonoscopy.  The patient was still on antibiotics when he was set up for colonoscopy and had an acute attack of diverticulitis.  The patient reports that he had diverticulitis back in 2021 in the month of October and was treated at that time.  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  The patient was recommended to have a colonoscopy to rule out any colon cancer or colon pathology that may explain this.  The patient did go through a procedure to look at his bladder and biopsies were reported to be negative for any malignancy in his bladder.  The patient now would like to come and talk to me prior to having the colonoscopy done to explain the procedure and the need for the procedure.  Past Medical History:  Diagnosis Date   Diverticulitis    History of kidney stones    History of nephrolithiasis     Past Surgical History:  Procedure Laterality Date   CYSTOSCOPY WITH BIOPSY N/A 02/06/2021   Procedure: CYSTOSCOPY WITH BLADDER BIOPSY;  Surgeon: Hollice Espy, MD;  Location: [facility] ORS;  Service: Urology;  Laterality: N/A;   HARDWARE REMOVAL Right 2011   KNEE ARTHROSCOPY Right 2017   clean out meniscus   KNEE ARTHROSCOPY Right 2015   cleaned out meniscus   KNEE ARTHROSCOPY Right    clean out meniscus   KNEE ARTHROSCOPY Right 12/2018   replaced broken hardware   KNEE ARTHROSCOPY Right 10/06/2020   partial medial meniscectomy   MEDIAL PARTIAL KNEE REPLACEMENT Right 2013   ORIF  2010    right knee; injury from work   OTHER SURGICAL HISTORY Right 1996   right elbow nerve decompression   ULNAR NERVE TRANSPOSITION Right 05/15/2017   Procedure: Right elbow ulnar nerve decompression and/or transposition and nerve wrapping;  Surgeon: Iran Planas, MD;  Location: [place];  Service: Orthopedics;  Laterality: Right;  90 mins    Prior to Admission medications   Medication Sig Start Date End Date Taking? Authorizing Provider  [employer] ASPIRIN LOW DOSE 81 MG EC tablet SMARTSIG:Tablet(s) By Mouth 10/04/20  Yes [provider]  cyclobenzaprine (FLEXERIL) 5 MG tablet Take 1 tablet (5 mg total) by mouth 3 (three) times daily as needed for muscle spasms. 01/23/21  Yes Pabon, Diego F, MD  Na Sulfate-K Sulfate-Mg Sulf (SUPREP BOWEL PREP KIT) 17.5-3.13-1.6 GM/177ML SOLN Take 1 kit by mouth as directed. 02/01/21  Yes Lucilla Lame, MD  ondansetron (ZOFRAN ODT) 4 MG disintegrating tablet Take 1 tablet (4 mg total) by mouth every 8 (eight) hours as needed for nausea or vomiting. 01/18/21  Yes Hollice Espy, MD  tamsulosin (FLOMAX) 0.4 MG CAPS capsule Take 1 capsule (0.4 mg total) by mouth daily. 01/12/21  Yes Vaillancourt, Aldona Bar, PA-C    Family History  Problem Relation Age of Onset   Heart disease Mother    Diabetes Father    Prostate cancer Neg Hx  Kidney cancer Neg Hx    Bladder Cancer Neg Hx      Social History   Tobacco Use   Smoking status: Never   Smokeless tobacco: Former    Types: Chew    Quit date: 12/2020  Substance Use Topics   Alcohol use: Yes    Alcohol/week: 4.0 standard drinks    Types: 4 Cans of beer per week   Drug use: No    Allergies as of 02/09/2021   (No Known Allergies)    Review of Systems:    All systems reviewed and negative except where noted in HPI.   Physical Exam:  BP 121/78 (BP Location: Left Arm, Patient Position: Sitting, Cuff Size: Large)   Pulse (!) 101   Ht 6' (1.829 m)   Wt 214 lb 9.6 oz (97.3 kg)   BMI 29.10 kg/m  No LMP for male  patient. General:   Alert,  Well-developed, well-nourished, pleasant and cooperative in NAD Head:  Normocephalic and atraumatic. Eyes:  Sclera clear, no icterus.   Conjunctiva pink. Ears:  Normal auditory acuity. Neck:  Supple; no masses or thyromegaly. Lungs:  Respirations even and unlabored.  Clear throughout to auscultation.   No wheezes, crackles, or rhonchi. No acute distress. Heart:  Regular rate and rhythm; no murmurs, clicks, rubs, or gallops. Abdomen:  Normal bowel sounds.  No bruits.  Soft, non-tender and non-distended without masses, hepatosplenomegaly or hernias noted.  No guarding or rebound tenderness.  Negative Carnett sign.   Rectal:  Deferred.  Pulses:  Normal pulses noted. Extremities:  No clubbing or edema.  No cyanosis. Neurologic:  Alert and oriented x3;  grossly normal neurologically. Skin:  Intact without significant lesions or rashes.  No jaundice. Lymph Nodes:  No significant cervical adenopathy. Psych:  Alert and cooperative. Normal mood and affect.  Imaging Studies: DG OR UROLOGY CYSTO IMAGE ([facility] ONLY)  Result Date: 02/06/2021 There is no interpretation for this exam.  This order is for images obtained during a surgical procedure.  Please See "Surgeries" Tab for more information regarding the procedure.   CT HEMATURIA WORKUP  Result Date: 01/17/2021 CLINICAL DATA:  Hematuria. EXAM: CT ABDOMEN AND PELVIS WITHOUT AND WITH CONTRAST TECHNIQUE: Multidetector CT imaging of the abdomen and pelvis was performed following the standard protocol before and following the bolus administration of intravenous contrast. CONTRAST:  12mL OMNIPAQUE IOHEXOL 350 MG/ML SOLN COMPARISON:  04/26/2020 FINDINGS: Lower chest: No acute abnormality. Hepatobiliary: There is hepatic steatosis. Tiny low-density structure in the posterior dome of liver measures 6 mm and is too small to characterize, image 12/10. Gallbladder is normal. No bile duct dilatation. Pancreas: Unremarkable. No pancreatic  ductal dilatation or surrounding inflammatory changes. Spleen: Upper limits of normal measuring 12.4 cm in cranial caudal dimension, image 92/5. No focal splenic lesion identified. Adrenals/Urinary Tract: Normal adrenal glands. No kidney stones identified bilaterally. Tiny low-attenuation structure within the lateral cortex of the left mid kidney is too small to characterize measuring 4 mm. Additionally there are 2 small low-density foci, also too small to characterize within the inferior pole of the left kidney measuring up to 7 mm. No signs of hydronephrosis or solid enhancing mass. No hydroureter or ureteral lithiasis. Stomach/Bowel: Stomach appears normal. The appendix is visualized and appears normal. Distal colonic diverticulosis noted. Sign scratch set there is wall thickening with surrounding pericolonic inflammatory fat stranding involving the sigmoid colon compatible with acute diverticulitis. The inflamed loop of sigmoid colon involves the dome of bladder with loss of normal fat  plane between the sigmoid colon and bladder dome. Gas noted within the non dependent portion of the urinary bladder. Wall thickening along the dome of bladder measures up to 1.4 cm. Vascular/Lymphatic: No significant vascular findings are present. No enlarged abdominal or pelvic lymph nodes. Reproductive: Prostate is unremarkable. Other: No free fluid or focal fluid collections. Musculoskeletal: No acute or significant osseous findings. IMPRESSION: 1. Imaging findings compatible with acute sigmoid diverticulitis. (Note: The appearance of the sigmoid colon may also be seen with colonic neoplasm and correlation with colon cancer screening is recommended following resolution of this acute episode.) 2. 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 The inflamed loop of sigmoid colon is inseparable from the dome of bladder with loss of normal fat plane between the 2 structures. Gas noted within the non dependent portion of the urinary bladder. 3. No  discrete fluid collection to suggest drainable abscess. 4. Hepatic steatosis. These results will be called to the ordering clinician or representative by the Radiologist Assistant, and communication documented in the PACS or [employer]. Electronically Signed   By: Kerby Moors M.D.   On: 01/17/2021 16:08    Assessment and Plan:   Jeremy Owens is a 45 y.o. 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  The patient states he passes air and particulate matter when he urinates which he reports to be very painful.  The patient finished his antibiotics last Thursday.  The patient is in need of a colonoscopy due to his diverticulitis and fistula.  The patient has been told that ideally it should be done 4 weeks to 6 weeks after his attack of diverticulitis.  Therefore the patient will have his colonoscopy set up for the beginning of next month.  The patient has been explained the plan and agrees with it.    Lucilla Lame, MD. Marval Regal    Note: This dictation was prepared with Dragon dictation along with smaller phrase technology. Any transcriptional errors that result from this process are unintentional.

## 2021-02-09 NOTE — Progress Notes (Signed)
Gastroenterology Consultation  Referring Provider:     Marcy Salvo* Primary Care Physician:  Talmage Nap, PA-C Primary Gastroenterologist:  Dr. Allen Norris     Reason for Consultation:     Diverticulitis        HPI:   Jeremy Owens is a 45 y.o. y/o male referred for consultation & management of diverticulitis by Dr. Lily Peer, Estill Dooms, PA-C.  This patient comes in to see me after previously being set up for a colonoscopy.  The patient was still on antibiotics when he was set up for colonoscopy and had an acute attack of diverticulitis.  The patient reports that he had diverticulitis back in 2021 in the month of October and was treated at that time.  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  The patient was recommended to have a colonoscopy to rule out any colon cancer or colon pathology that may explain this.  The patient did go through a procedure to look at his bladder and biopsies were reported to be negative for any malignancy in his bladder.  The patient now would like to come and talk to me prior to having the colonoscopy done to explain the procedure and the need for the procedure.  Past Medical History:  Diagnosis Date   Diverticulitis    History of kidney stones    History of nephrolithiasis     Past Surgical History:  Procedure Laterality Date   CYSTOSCOPY WITH BIOPSY N/A 02/06/2021   Procedure: CYSTOSCOPY WITH BLADDER BIOPSY;  Surgeon: Hollice Espy, MD;  Location: [facility];  Service: Urology;  Laterality: N/A;   HARDWARE REMOVAL Right 2011   KNEE ARTHROSCOPY Right 2017   clean out meniscus   KNEE ARTHROSCOPY Right 2015   cleaned out meniscus   KNEE ARTHROSCOPY Right    clean out meniscus   KNEE ARTHROSCOPY Right 12/2018   replaced broken hardware   KNEE ARTHROSCOPY Right 10/06/2020   partial medial meniscectomy   MEDIAL PARTIAL KNEE REPLACEMENT Right 2013   ORIF  2010    right knee; injury from work   OTHER SURGICAL HISTORY Right 1996   right elbow nerve decompression   ULNAR NERVE TRANSPOSITION Right 05/15/2017   Procedure: Right elbow ulnar nerve decompression and/or transposition and nerve wrapping;  Surgeon: Iran Planas, MD;  Location: [place];  Service: Orthopedics;  Laterality: Right;  90 mins    Prior to Admission medications   Medication Sig Start Date End Date Taking? Authorizing Provider  CVS ASPIRIN LOW DOSE 81 MG EC tablet SMARTSIG:Tablet(s) By Mouth 10/04/20  Yes [provider]  cyclobenzaprine (FLEXERIL) 5 MG tablet Take 1 tablet (5 mg total) by mouth 3 (three) times daily as needed for muscle spasms. 01/23/21  Yes Pabon, Diego F, MD  Na Sulfate-K Sulfate-Mg Sulf (SUPREP BOWEL PREP KIT) 17.5-3.13-1.6 GM/177ML SOLN Take 1 kit by mouth as directed. 02/01/21  Yes Lucilla Lame, MD  ondansetron (ZOFRAN ODT) 4 MG disintegrating tablet Take 1 tablet (4 mg total) by mouth every 8 (eight) hours as needed for nausea or vomiting. 01/18/21  Yes Hollice Espy, MD  tamsulosin (FLOMAX) 0.4 MG CAPS capsule Take 1 capsule (0.4 mg total) by mouth daily. 01/12/21  Yes Vaillancourt, Aldona Bar, PA-C    Family History  Problem Relation Age of Onset   Heart disease Mother    Diabetes Father    Prostate cancer Neg Hx  Kidney cancer Neg Hx    Bladder Cancer Neg Hx      Social History   Tobacco Use   Smoking status: Never   Smokeless tobacco: Former    Types: Chew    Quit date: 12/2020  Substance Use Topics   Alcohol use: Yes    Alcohol/week: 4.0 standard drinks    Types: 4 Cans of beer per week   Drug use: No    Allergies as of 02/09/2021   (No Known Allergies)    Review of Systems:    All systems reviewed and negative except where noted in HPI.   Physical Exam:  BP 121/78 (BP Location: Left Arm, Patient Position: Sitting, Cuff Size: Large)   Pulse (!) 101   Ht 6' (1.829 m)   Wt 214 lb 9.6 oz (97.3 kg)   BMI 29.10 kg/m  No LMP for male  patient. General:   Alert,  Well-developed, well-nourished, pleasant and cooperative in NAD Head:  Normocephalic and atraumatic. Eyes:  Sclera clear, no icterus.   Conjunctiva pink. Ears:  Normal auditory acuity. Neck:  Supple; no masses or thyromegaly. Lungs:  Respirations even and unlabored.  Clear throughout to auscultation.   No wheezes, crackles, or rhonchi. No acute distress. Heart:  Regular rate and rhythm; no murmurs, clicks, rubs, or gallops. Abdomen:  Normal bowel sounds.  No bruits.  Soft, non-tender and non-distended without masses, hepatosplenomegaly or hernias noted.  No guarding or rebound tenderness.  Negative Carnett sign.   Rectal:  Deferred.  Pulses:  Normal pulses noted. Extremities:  No clubbing or edema.  No cyanosis. Neurologic:  Alert and oriented x3;  grossly normal neurologically. Skin:  Intact without significant lesions or rashes.  No jaundice. Lymph Nodes:  No significant cervical adenopathy. Psych:  Alert and cooperative. Normal mood and affect.  Imaging Studies: DG OR UROLOGY CYSTO IMAGE ([facility] ONLY)  Result Date: 02/06/2021 There is no interpretation for this exam.  This order is for images obtained during a surgical procedure.  Please See "Surgeries" Tab for more information regarding the procedure.   CT HEMATURIA WORKUP  Result Date: 01/17/2021 CLINICAL DATA:  Hematuria. EXAM: CT ABDOMEN AND PELVIS WITHOUT AND WITH CONTRAST TECHNIQUE: Multidetector CT imaging of the abdomen and pelvis was performed following the standard protocol before and following the bolus administration of intravenous contrast. CONTRAST:  173mL OMNIPAQUE IOHEXOL 350 MG/ML SOLN COMPARISON:  04/26/2020 FINDINGS: Lower chest: No acute abnormality. Hepatobiliary: There is hepatic steatosis. Tiny low-density structure in the posterior dome of liver measures 6 mm and is too small to characterize, image 12/10. Gallbladder is normal. No bile duct dilatation. Pancreas: Unremarkable. No pancreatic  ductal dilatation or surrounding inflammatory changes. Spleen: Upper limits of normal measuring 12.4 cm in cranial caudal dimension, image 92/5. No focal splenic lesion identified. Adrenals/Urinary Tract: Normal adrenal glands. No kidney stones identified bilaterally. Tiny low-attenuation structure within the lateral cortex of the left mid kidney is too small to characterize measuring 4 mm. Additionally there are 2 small low-density foci, also too small to characterize within the inferior pole of the left kidney measuring up to 7 mm. No signs of hydronephrosis or solid enhancing mass. No hydroureter or ureteral lithiasis. Stomach/Bowel: Stomach appears normal. The appendix is visualized and appears normal. Distal colonic diverticulosis noted. Sign scratch set there is wall thickening with surrounding pericolonic inflammatory fat stranding involving the sigmoid colon compatible with acute diverticulitis. The inflamed loop of sigmoid colon involves the dome of bladder with loss of normal fat  plane between the sigmoid colon and bladder dome. Gas noted within the non dependent portion of the urinary bladder. Wall thickening along the dome of bladder measures up to 1.4 cm. Vascular/Lymphatic: No significant vascular findings are present. No enlarged abdominal or pelvic lymph nodes. Reproductive: Prostate is unremarkable. Other: No free fluid or focal fluid collections. Musculoskeletal: No acute or significant osseous findings. IMPRESSION: 1. Imaging findings compatible with acute sigmoid diverticulitis. (Note: The appearance of the sigmoid colon may also be seen with colonic neoplasm and correlation with colon cancer screening is recommended following resolution of this acute episode.) 2. 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 The inflamed loop of sigmoid colon is inseparable from the dome of bladder with loss of normal fat plane between the 2 structures. Gas noted within the non dependent portion of the urinary bladder. 3. No  discrete fluid collection to suggest drainable abscess. 4. Hepatic steatosis. These results will be called to the ordering clinician or representative by the Radiologist Assistant, and communication documented in the PACS or [employer]. Electronically Signed   By: Kerby Moors M.D.   On: 01/17/2021 16:08    Assessment and Plan:   Jeremy Owens is a 44 y.o. 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  The patient states he passes air and particulate matter when he urinates which he reports to be very painful.  The patient finished his antibiotics last Thursday.  The patient is in need of a colonoscopy due to his diverticulitis and fistula.  The patient has been told that ideally it should be done 4 weeks to 6 weeks after his attack of diverticulitis.  Therefore the patient will have his colonoscopy set up for the beginning of next month.  The patient has been explained the plan and agrees with it.    Lucilla Lame, MD. Marval Regal    Note: This dictation was prepared with Dragon dictation along with smaller phrase technology. Any transcriptional errors that result from this process are unintentional.

## 2021-02-15 ENCOUNTER — Ambulatory Visit: Payer: BC Managed Care – PPO | Admitting: Surgery

## 2021-02-16 ENCOUNTER — Encounter: Payer: Self-pay | Admitting: Gastroenterology

## 2021-02-20 DIAGNOSIS — Z20822 Contact with and (suspected) exposure to covid-19: Secondary | ICD-10-CM | POA: Diagnosis not present

## 2021-02-27 ENCOUNTER — Ambulatory Visit: Payer: BC Managed Care – PPO | Admitting: Surgery

## 2021-03-02 ENCOUNTER — Other Ambulatory Visit: Payer: Self-pay

## 2021-03-02 ENCOUNTER — Ambulatory Visit
Admission: RE | Admit: 2021-03-02 | Discharge: 2021-03-02 | Disposition: A | Discharge status: Home / Self Care | Payer: BC Managed Care – PPO | Attending: Gastroenterology | Admitting: Gastroenterology

## 2021-03-02 ENCOUNTER — Encounter: Payer: Self-pay | Admitting: Gastroenterology

## 2021-03-02 ENCOUNTER — Encounter
Admission: RE | Disposition: A | Discharge status: Home / Self Care | Payer: Self-pay | Source: Home / Self Care | Attending: Gastroenterology

## 2021-03-02 ENCOUNTER — Ambulatory Visit: Payer: BC Managed Care – PPO | Admitting: Anesthesiology

## 2021-03-02 DIAGNOSIS — K760 Fatty (change of) liver, not elsewhere classified: Secondary | ICD-10-CM | POA: Diagnosis not present

## 2021-03-02 DIAGNOSIS — K621 Rectal polyp: Secondary | ICD-10-CM | POA: Diagnosis not present

## 2021-03-02 DIAGNOSIS — K5732 Diverticulitis of large intestine without perforation or abscess without bleeding: Secondary | ICD-10-CM

## 2021-03-02 DIAGNOSIS — N3091 Cystitis, unspecified with hematuria: Secondary | ICD-10-CM | POA: Diagnosis not present

## 2021-03-02 DIAGNOSIS — K5730 Diverticulosis of large intestine without perforation or abscess without bleeding: Secondary | ICD-10-CM | POA: Diagnosis not present

## 2021-03-02 DIAGNOSIS — Z7982 Long term (current) use of aspirin: Secondary | ICD-10-CM | POA: Diagnosis not present

## 2021-03-02 DIAGNOSIS — K635 Polyp of colon: Secondary | ICD-10-CM | POA: Diagnosis not present

## 2021-03-02 DIAGNOSIS — Z79899 Other long term (current) drug therapy: Secondary | ICD-10-CM | POA: Diagnosis not present

## 2021-03-02 DIAGNOSIS — Z8719 Personal history of other diseases of the digestive system: Secondary | ICD-10-CM | POA: Diagnosis not present

## 2021-03-02 HISTORY — PX: POLYPECTOMY: SHX5525

## 2021-03-02 HISTORY — PX: COLONOSCOPY WITH PROPOFOL: SHX5780

## 2021-03-02 SURGERY — COLONOSCOPY WITH PROPOFOL
Anesthesia: General | Site: Rectum

## 2021-03-02 MED ORDER — ACETAMINOPHEN 325 MG PO TABS: 325.0000 mg | ORAL_TABLET | ORAL | Status: DC | PRN

## 2021-03-02 MED ORDER — SODIUM CHLORIDE 0.9 % IV SOLN: INTRAVENOUS | Status: DC

## 2021-03-02 MED ORDER — ONDANSETRON HCL 4 MG/2ML IJ SOLN: 4.0000 mg | Freq: Once | INTRAMUSCULAR | Status: DC | PRN

## 2021-03-02 MED ORDER — PROPOFOL 10 MG/ML IV BOLUS
INTRAVENOUS | Status: DC | PRN
  Administered 2021-03-02: 50 mg via INTRAVENOUS
  Administered 2021-03-02: 40 mg via INTRAVENOUS
  Administered 2021-03-02: 50 mg via INTRAVENOUS
  Administered 2021-03-02 (×4): 40 mg via INTRAVENOUS
  Administered 2021-03-02: 80 mg via INTRAVENOUS
  Administered 2021-03-02: 40 mg via INTRAVENOUS
  Administered 2021-03-02: 30 mg via INTRAVENOUS

## 2021-03-02 MED ORDER — ACETAMINOPHEN 160 MG/5ML PO SOLN: 325.0000 mg | ORAL | Status: DC | PRN

## 2021-03-02 MED ORDER — LACTATED RINGERS IV SOLN
INTRAVENOUS | Status: DC
Start: 2021-03-02 — End: 2021-03-02

## 2021-03-02 MED ORDER — LIDOCAINE HCL (CARDIAC) PF 100 MG/5ML IV SOSY
PREFILLED_SYRINGE | INTRAVENOUS | Status: DC | PRN
  Administered 2021-03-02: 50 mg via INTRAVENOUS

## 2021-03-02 MED ORDER — STERILE WATER FOR IRRIGATION IR SOLN: Status: DC | PRN

## 2021-03-02 SURGICAL SUPPLY — 7 items
FORCEPS BIOP RAD 4 LRG CAP 4 (CUTTING FORCEPS) ×1 IMPLANT
GOWN CVR UNV OPN BCK APRN NK (MISCELLANEOUS) ×2 IMPLANT
GOWN ISOL THUMB LOOP REG UNIV (MISCELLANEOUS) ×4
KIT PRC NS LF DISP ENDO (KITS) ×1 IMPLANT
KIT PROCEDURE OLYMPUS (KITS) ×2
MANIFOLD NEPTUNE II (INSTRUMENTS) ×2 IMPLANT
WATER STERILE IRR 250ML POUR (IV SOLUTION) ×2 IMPLANT

## 2021-03-02 NOTE — Anesthesia Postprocedure Evaluation (Signed)
Anesthesia Post Note  Patient: Jeremy Owens  Procedure(s) Performed: COLONOSCOPY WITH BIOPSY (Rectum) POLYPECTOMY (Rectum)     Patient location during evaluation: PACU Anesthesia Type: General Level of consciousness: awake Pain management: pain level controlled Vital Signs Assessment: post-procedure vital signs reviewed and stable Respiratory status: respiratory function stable Cardiovascular status: stable Postop Assessment: no signs of nausea or vomiting Anesthetic complications: no   No notable events documented.  Jola Babinski

## 2021-03-02 NOTE — Op Note (Signed)
[facility] Gastroenterology Patient Name: Jeremy Owens Procedure Date: 03/02/2021 10:11 AM MRN: [id number] Account #: [id number] Date of Birth: 1976/06/06 Admit Type: Outpatient Age: 45 Room: [facility] OR ROOM 01 Gender: Male Note Status: Finalized Procedure:             Colonoscopy Indications:           Follow-up of diverticulitis Providers:             Midge Minium MD, MD Referring MD:          Wynn Banker. Ratcliffe, MD (Referring MD) Medicines:             Propofol per Anesthesia Complications:         No immediate complications. Procedure:             Pre-Anesthesia Assessment:                        - Prior to the procedure, a History and Physical was                         performed, and patient medications and allergies were                         reviewed. The patient's tolerance of previous                         anesthesia was also reviewed. The risks and benefits                         of the procedure and the sedation options and risks                         were discussed with the patient. All questions were                         answered, and informed consent was obtained. Prior                         Anticoagulants: The patient has taken no previous                         anticoagulant or antiplatelet agents. ASA Grade                         Assessment: II - A patient with mild systemic disease.                         After reviewing the risks and benefits, the patient                         was deemed in satisfactory condition to undergo the                         procedure.                        After obtaining informed consent, the colonoscope was  passed under direct vision. Throughout the procedure,                         the patient's blood pressure, pulse, and oxygen                         saturations were monitored continuously. The                         Colonoscope was introduced through the anus and                          advanced to the the cecum, identified by appendiceal                         orifice and ileocecal valve. The colonoscopy was                         performed without difficulty. The patient tolerated                         the procedure well. The quality of the bowel                         preparation was excellent. Findings:      The perianal and digital rectal examinations were normal.      Multiple small-mouthed diverticula were found in the sigmoid colon and       descending colon.      Three sessile polyps were found in the rectum. The polyps were 2 to 3 mm       in size. These polyps were removed with a cold biopsy forceps. Resection       and retrieval were complete. Impression:            - Diverticulosis in the sigmoid colon and in the                         descending colon.                        - Three 2 to 3 mm polyps in the rectum, removed with a                         cold biopsy forceps. Resected and retrieved. Recommendation:        - Discharge patient to home.                        - Resume previous diet.                        - Continue present medications.                        - Await pathology results.                        - Repeat colonoscopy in 5 years for surveillance if                         adenomatous  otherwise 10 years. Midge Minium MD, MD 03/02/2021 10:38:42 AM This report has been signed electronically. Number of Addenda: 0 Note Initiated On: 03/02/2021 10:11 AM Scope Withdrawal Time: 0 hours 6 minutes 42 seconds  Total Procedure Duration: 0 hours 11 minutes 7 seconds  Estimated Blood Loss:  Estimated blood loss: none.      [facility]

## 2021-03-02 NOTE — Anesthesia Preprocedure Evaluation (Signed)
Anesthesia Evaluation  Patient identified by MRN, date of birth, ID band Patient awake    Reviewed: Allergy & Precautions, NPO status   Airway Mallampati: II  TM Distance: >3 FB     Dental   Pulmonary    Pulmonary exam normal        Cardiovascular negative cardio ROS   Rhythm:Regular Rate:Normal     Neuro/Psych    GI/Hepatic negative GI ROS,   Endo/Other    Renal/GU      Musculoskeletal  (+) Arthritis ,   Abdominal   Peds  Hematology   Anesthesia Other Findings   Reproductive/Obstetrics                             Anesthesia Physical Anesthesia Plan  ASA: 2  Anesthesia Plan: General   Post-op Pain Management:    Induction: Intravenous  PONV Risk Score and Plan: Propofol infusion, TIVA and Treatment may vary due to age or medical condition  Airway Management Planned: Natural Airway and Nasal Cannula  Additional Equipment:   Intra-op Plan:   Post-operative Plan:   Informed Consent: I have reviewed the patients History and Physical, chart, labs and discussed the procedure including the risks, benefits and alternatives for the proposed anesthesia with the patient or authorized representative who has indicated his/her understanding and acceptance.       Plan Discussed with: CRNA  Anesthesia Plan Comments:         Anesthesia Quick Evaluation

## 2021-03-02 NOTE — Transfer of Care (Signed)
Immediate Anesthesia Transfer of Care Note  Patient: Jeremy Owens  Procedure(s) Performed: COLONOSCOPY WITH BIOPSY (Rectum) POLYPECTOMY (Rectum)  Patient Location: PACU  Anesthesia Type: General  Level of Consciousness: awake, alert  and patient cooperative  Airway and Oxygen Therapy: Patient Spontanous Breathing and Patient connected to supplemental oxygen  Post-op Assessment: Post-op Vital signs reviewed, Patient's Cardiovascular Status Stable, Respiratory Function Stable, Patent Airway and No signs of Nausea or vomiting  Post-op Vital Signs: Reviewed and stable  Complications: No notable events documented.

## 2021-03-02 NOTE — Interval H&P Note (Signed)
Lucilla Lame, MD [address] [place], [place] [address] Phone:[phone] Fax : [phone]  Primary Care Physician:  Talmage Nap, PA-C Primary Gastroenterologist:  Dr. Allen Norris  Pre-Procedure History & Physical: HPI:  Jeremy Owens is a 45 y.o. male is here for an colonoscopy.   Past Medical History:  Diagnosis Date   Diverticulitis    History of kidney stones    History of nephrolithiasis     Past Surgical History:  Procedure Laterality Date   CYSTOSCOPY WITH BIOPSY N/A 02/06/2021   Procedure: CYSTOSCOPY WITH BLADDER BIOPSY;  Surgeon: Hollice Espy, MD;  Location: [facility] ORS;  Service: Urology;  Laterality: N/A;   HARDWARE REMOVAL Right 2011   KNEE ARTHROSCOPY Right 2017   clean out meniscus   KNEE ARTHROSCOPY Right 2015   cleaned out meniscus   KNEE ARTHROSCOPY Right    clean out meniscus   KNEE ARTHROSCOPY Right 12/2018   replaced broken hardware   KNEE ARTHROSCOPY Right 10/06/2020   partial medial meniscectomy   MEDIAL PARTIAL KNEE REPLACEMENT Right 2013   ORIF  2010   right knee; injury from work   OTHER SURGICAL HISTORY Right 1996   right elbow nerve decompression   ULNAR NERVE TRANSPOSITION Right 05/15/2017   Procedure: Right elbow ulnar nerve decompression and/or transposition and nerve wrapping;  Surgeon: Iran Planas, MD;  Location: [place];  Service: Orthopedics;  Laterality: Right;  90 mins    Prior to Admission medications   Medication Sig Start Date End Date Taking? Authorizing Provider  cyclobenzaprine (FLEXERIL) 5 MG tablet Take 1 tablet (5 mg total) by mouth 3 (three) times daily as needed for muscle spasms. 01/23/21  Yes Pabon, Diego F, MD  Na Sulfate-K Sulfate-Mg Sulf (SUPREP BOWEL PREP KIT) 17.5-3.13-1.6 GM/177ML SOLN Take 1 kit by mouth as directed. 02/01/21  Yes Lucilla Lame, MD  tamsulosin (FLOMAX) 0.4 MG CAPS capsule Take 1 capsule (0.4 mg total) by mouth daily. 01/12/21  Yes Vaillancourt, Samantha, PA-C  CVS ASPIRIN LOW DOSE  81 MG EC tablet SMARTSIG:Tablet(s) By Mouth Patient not taking: Reported on 02/16/2021 10/04/20   [provider]  ondansetron (ZOFRAN ODT) 4 MG disintegrating tablet Take 1 tablet (4 mg total) by mouth every 8 (eight) hours as needed for nausea or vomiting. Patient not taking: Reported on 02/16/2021 01/18/21   Hollice Espy, MD    Allergies as of 02/09/2021   (No Known Allergies)    Family History  Problem Relation Age of Onset   Heart disease Mother    Diabetes Father    Prostate cancer Neg Hx    Kidney cancer Neg Hx    Bladder Cancer Neg Hx     Social History   Socioeconomic History   Marital status: Married    Spouse name: Caryl Pina   Number of children: 2   Years of education: Not on file   Highest education level: Not on file  Occupational History   Not on file  Tobacco Use   Smoking status: Never   Smokeless tobacco: Former    Types: Chew    Quit date: 12/2020  Vaping Use   Vaping Use: Not on file  Substance and Sexual Activity   Alcohol use: Yes    Alcohol/week: 4.0 standard drinks    Types: 4 Cans of beer per week   Drug use: No   Sexual activity: Yes  Other Topics Concern   Not on file  Social History Narrative   Not on file  Social Determinants of Health   Financial Resource Strain: Not on file  Food Insecurity: Not on file  Transportation Needs: Not on file  Physical Activity: Not on file  Stress: Not on file  Social Connections: Not on file  Intimate Partner Violence: Not on file    Review of Systems: See HPI, otherwise negative ROS  Physical Exam: BP 128/85   Pulse 80   Temp (!) 97.5 F (36.4 C) (Temporal)   Resp 18   Ht 6' (1.829 m)   Wt 94.8 kg   SpO2 99%   BMI 28.35 kg/m  General:   Alert,  pleasant and cooperative in NAD Head:  Normocephalic and atraumatic. Neck:  Supple; no masses or thyromegaly. Lungs:  Clear throughout to auscultation.    Heart:  Regular rate and rhythm. Abdomen:  Soft, nontender and nondistended.  Normal bowel sounds, without guarding, and without rebound.   Neurologic:  Alert and  oriented x4;  grossly normal neurologically.  Impression/Plan: Jeremy Owens is here for an colonoscopy to be performed for history of diverticulitis  Risks, benefits, limitations, and alternatives regarding  colonoscopy have been reviewed with the patient.  Questions have been answered.  All parties agreeable.   Lucilla Lame, MD  03/02/2021, 9:47 AM

## 2021-03-02 NOTE — Anesthesia Procedure Notes (Signed)
Date/Time: 03/02/2021 10:19 AM Performed by: Jimmy Picket, CRNA Pre-anesthesia Checklist: Patient identified, Emergency Drugs available, Suction available, Timeout performed and Patient being monitored Patient Re-evaluated:Patient Re-evaluated prior to induction Oxygen Delivery Method: Nasal cannula Placement Confirmation: positive ETCO2

## 2021-03-03 ENCOUNTER — Other Ambulatory Visit (INDEPENDENT_AMBULATORY_CARE_PROVIDER_SITE_OTHER): Payer: BC Managed Care – PPO | Admitting: Urology

## 2021-03-03 ENCOUNTER — Encounter: Payer: Self-pay | Admitting: Gastroenterology

## 2021-03-03 ENCOUNTER — Encounter: Payer: Self-pay | Admitting: Urology

## 2021-03-03 LAB — SURGICAL PATHOLOGY

## 2021-03-03 MED ORDER — CIPROFLOXACIN HCL 500 MG PO TABS: 500.0000 mg | ORAL_TABLET | Freq: Two times a day (BID) | ORAL | 0 refills | Status: DC

## 2021-03-03 MED ORDER — METRONIDAZOLE 500 MG PO TABS: 500.0000 mg | ORAL_TABLET | Freq: Three times a day (TID) | ORAL | 0 refills | Status: DC

## 2021-03-03 NOTE — Progress Notes (Signed)
See my chart message

## 2021-03-04 ENCOUNTER — Encounter (HOSPITAL_BASED_OUTPATIENT_CLINIC_OR_DEPARTMENT_OTHER): Payer: Self-pay | Admitting: Emergency Medicine

## 2021-03-04 ENCOUNTER — Emergency Department (HOSPITAL_BASED_OUTPATIENT_CLINIC_OR_DEPARTMENT_OTHER): Payer: BC Managed Care – PPO

## 2021-03-04 ENCOUNTER — Encounter: Payer: Self-pay | Admitting: Emergency Medicine

## 2021-03-04 ENCOUNTER — Telehealth: Payer: BC Managed Care – PPO | Admitting: Physician Assistant

## 2021-03-04 ENCOUNTER — Encounter: Payer: Self-pay | Admitting: Physician Assistant

## 2021-03-04 ENCOUNTER — Ambulatory Visit
Admission: EM | Admit: 2021-03-04 | Discharge: 2021-03-04 | Disposition: A | Discharge status: Home / Self Care | Payer: BC Managed Care – PPO | Attending: Emergency Medicine | Admitting: Emergency Medicine

## 2021-03-04 ENCOUNTER — Emergency Department (HOSPITAL_BASED_OUTPATIENT_CLINIC_OR_DEPARTMENT_OTHER)
Admission: EM | Admit: 2021-03-04 | Discharge: 2021-03-04 | Disposition: A | Discharge status: Home / Self Care | Payer: BC Managed Care – PPO | Attending: Emergency Medicine | Admitting: Emergency Medicine

## 2021-03-04 ENCOUNTER — Other Ambulatory Visit: Payer: Self-pay

## 2021-03-04 DIAGNOSIS — K5730 Diverticulosis of large intestine without perforation or abscess without bleeding: Secondary | ICD-10-CM | POA: Diagnosis not present

## 2021-03-04 DIAGNOSIS — R300 Dysuria: Secondary | ICD-10-CM | POA: Diagnosis not present

## 2021-03-04 DIAGNOSIS — Z87891 Personal history of nicotine dependence: Secondary | ICD-10-CM | POA: Diagnosis not present

## 2021-03-04 DIAGNOSIS — R161 Splenomegaly, not elsewhere classified: Secondary | ICD-10-CM | POA: Diagnosis not present

## 2021-03-04 DIAGNOSIS — R6883 Chills (without fever): Secondary | ICD-10-CM

## 2021-03-04 DIAGNOSIS — R509 Fever, unspecified: Secondary | ICD-10-CM | POA: Diagnosis not present

## 2021-03-04 DIAGNOSIS — R102 Pelvic and perineal pain: Secondary | ICD-10-CM | POA: Diagnosis not present

## 2021-03-04 DIAGNOSIS — N3289 Other specified disorders of bladder: Secondary | ICD-10-CM | POA: Diagnosis not present

## 2021-03-04 DIAGNOSIS — R000 Tachycardia, unspecified: Secondary | ICD-10-CM | POA: Insufficient documentation

## 2021-03-04 DIAGNOSIS — N322 Vesical fistula, not elsewhere classified: Secondary | ICD-10-CM | POA: Diagnosis not present

## 2021-03-04 DIAGNOSIS — R109 Unspecified abdominal pain: Secondary | ICD-10-CM | POA: Diagnosis not present

## 2021-03-04 DIAGNOSIS — K5792 Diverticulitis of intestine, part unspecified, without perforation or abscess without bleeding: Secondary | ICD-10-CM | POA: Diagnosis not present

## 2021-03-04 DIAGNOSIS — Z96651 Presence of right artificial knee joint: Secondary | ICD-10-CM | POA: Insufficient documentation

## 2021-03-04 LAB — CBC WITH DIFFERENTIAL/PLATELET
Abs Immature Granulocytes: 0.03 10*3/uL (ref 0.00–0.07)
Basophils Absolute: 0 10*3/uL (ref 0.0–0.1)
Basophils Relative: 0 %
Eosinophils Absolute: 0 10*3/uL (ref 0.0–0.5)
Eosinophils Relative: 0 %
HCT: 44.5 % (ref 39.0–52.0)
Hemoglobin: 15.3 g/dL (ref 13.0–17.0)
Immature Granulocytes: 0 %
Lymphocytes Relative: 9 %
Lymphs Abs: 0.9 10*3/uL (ref 0.7–4.0)
MCH: 30.8 pg (ref 26.0–34.0)
MCHC: 34.4 g/dL (ref 30.0–36.0)
MCV: 89.7 fL (ref 80.0–100.0)
Monocytes Absolute: 1 10*3/uL (ref 0.1–1.0)
Monocytes Relative: 10 %
Neutro Abs: 7.6 10*3/uL (ref 1.7–7.7)
Neutrophils Relative %: 81 %
Platelets: 164 10*3/uL (ref 150–400)
RBC: 4.96 MIL/uL (ref 4.22–5.81)
RDW: 13.3 % (ref 11.5–15.5)
WBC: 9.5 10*3/uL (ref 4.0–10.5)
nRBC: 0 % (ref 0.0–0.2)

## 2021-03-04 LAB — URINALYSIS, ROUTINE W REFLEX MICROSCOPIC
Bilirubin Urine: NEGATIVE
Glucose, UA: NEGATIVE mg/dL
Ketones, ur: NEGATIVE mg/dL
Nitrite: NEGATIVE
Protein, ur: 30 mg/dL — AB
Specific Gravity, Urine: 1.006 (ref 1.005–1.030)
WBC, UA: >50 WBC/hpf — ABNORMAL HIGH (ref 0–5)
pH: 5.5 (ref 5.0–8.0)

## 2021-03-04 LAB — LACTIC ACID, PLASMA: Lactic Acid, Venous: 1.1 mmol/L (ref 0.5–1.9)

## 2021-03-04 LAB — POCT URINALYSIS DIP (MANUAL ENTRY)
Bilirubin, UA: NEGATIVE
Glucose, UA: NEGATIVE mg/dL
Ketones, POC UA: NEGATIVE mg/dL
Nitrite, UA: NEGATIVE
Protein Ur, POC: 30 mg/dL — AB
Spec Grav, UA: 1.01 (ref 1.010–1.025)
Urobilinogen, UA: 0.2 E.U./dL
pH, UA: 5 (ref 5.0–8.0)

## 2021-03-04 LAB — COMPREHENSIVE METABOLIC PANEL
ALT: 24 U/L (ref 0–44)
AST: 16 U/L (ref 15–41)
Albumin: 4.3 g/dL (ref 3.5–5.0)
Alkaline Phosphatase: 56 U/L (ref 38–126)
Anion gap: 10 (ref 5–15)
BUN: 10 mg/dL (ref 6–20)
CO2: 28 mmol/L (ref 22–32)
Calcium: 9.5 mg/dL (ref 8.9–10.3)
Chloride: 100 mmol/L (ref 98–111)
Creatinine, Ser: 1.11 mg/dL (ref 0.61–1.24)
GFR, Estimated: >60 mL/min (ref >60)
Glucose, Bld: 84 mg/dL (ref 70–99)
Potassium: 3.3 mmol/L — ABNORMAL LOW (ref 3.5–5.1)
Sodium: 138 mmol/L (ref 135–145)
Total Bilirubin: 1 mg/dL (ref 0.3–1.2)
Total Protein: 7.7 g/dL (ref 6.5–8.1)

## 2021-03-04 LAB — LIPASE, BLOOD: Lipase: 16 U/L (ref 11–51)

## 2021-03-04 MED ORDER — IOHEXOL 350 MG/ML SOLN
80.0000 mL | Freq: Once | INTRAVENOUS | Status: AC | PRN
  Administered 2021-03-04: 80 mL via INTRAVENOUS

## 2021-03-04 MED ORDER — HYDROCODONE-ACETAMINOPHEN 5-325 MG PO TABS: 1.0000 | ORAL_TABLET | Freq: Four times a day (QID) | ORAL | 0 refills | Status: DC | PRN

## 2021-03-04 MED ORDER — SODIUM CHLORIDE 0.9 % IV BOLUS
1000.0000 mL | Freq: Once | INTRAVENOUS | Status: AC
  Administered 2021-03-04: 1000 mL via INTRAVENOUS

## 2021-03-04 MED ORDER — CIPROFLOXACIN HCL 500 MG PO TABS: 500.0000 mg | ORAL_TABLET | Freq: Two times a day (BID) | ORAL | 0 refills | Status: DC

## 2021-03-04 MED ORDER — METRONIDAZOLE 500 MG/100ML IV SOLN
500.0000 mg | Freq: Once | INTRAVENOUS | Status: AC
  Administered 2021-03-04: 500 mg via INTRAVENOUS
  Filled 2021-03-04: qty 100

## 2021-03-04 MED ORDER — LEVOFLOXACIN IN D5W 750 MG/150ML IV SOLN
750.0000 mg | Freq: Once | INTRAVENOUS | Status: AC
  Administered 2021-03-04: 750 mg via INTRAVENOUS
  Filled 2021-03-04: qty 150

## 2021-03-04 MED ORDER — ONDANSETRON 4 MG PO TBDP: 4.0000 mg | ORAL_TABLET | ORAL | 0 refills | Status: DC | PRN

## 2021-03-04 NOTE — ED Notes (Signed)
Blood cultures drawn x 2 and sent to lab before start of antibotics.

## 2021-03-04 NOTE — Discharge Instructions (Addendum)
Your current condition warrants further evaluation and/or treatment which exceed services available to you in this urgent care setting. I have discussed with you your currrent condition and the need for further evaluation and/or treatment in an emergency department setting. In response to my medical recommendation, you have opted to go to the emergency department. 

## 2021-03-04 NOTE — Progress Notes (Signed)
Duplicate. Patient scheduled video visit

## 2021-03-04 NOTE — ED Provider Notes (Signed)
[facility] EMERGENCY DEPT Provider Note   CSN: [id number] Arrival date & time: 03/04/21  1610     History Chief Complaint  Patient presents with   Back Pain    Jeremy Owens is a 45 y.o. male.  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  Patient had a colonoscopy 3 days ago as part of the diagnostic screening for repair.  Patient reports he was not having any problems.  Reports he felt that he recovered well from his colonoscopy.  He had had a cystoscopy about a week earlier.  Patient reports however yesterday he started getting some low back pain and flank pain.  He also got a low-grade temperature up to 100.9.  He was having some chills at that time.  He reports that he feels better now but has been advised that if he gets any fever he is to immediately be seen for concern of possible complicated UTI with sepsis.  He reports ever since the fistula developed has had some persistent suprapubic pain and does not feel it is different from baseline.  By instruction from his urologist, he restarted ciprofloxacin and Flagyl yesterday.    Past Medical History:  Diagnosis Date   Diverticulitis    History of kidney stones    History of nephrolithiasis     Patient Active Problem List   Diagnosis Date Noted   Diverticulitis of colon    Rectal polyp    History of nephrolithiasis 01/12/2021   Pain in right knee 04/28/2018   Knee pain 10/30/2017   Localized, primary osteoarthritis 10/30/2017   Osteoarthritis of knee 03/07/2017   Right knee injury    Synovitis of knee 01/11/2016   History of total knee arthroplasty 01/11/2016   H/O elbow surgery 03/08/1995    Past Surgical History:  Procedure Laterality Date   COLONOSCOPY WITH PROPOFOL N/A 03/02/2021   Procedure: COLONOSCOPY WITH BIOPSY;  Surgeon: Lucilla Lame, MD;  Location: [place];  Service: Endoscopy;  Laterality: N/A;   CYSTOSCOPY WITH BIOPSY  N/A 02/06/2021   Procedure: CYSTOSCOPY WITH BLADDER BIOPSY;  Surgeon: Hollice Espy, MD;  Location: [facility];  Service: Urology;  Laterality: N/A;   HARDWARE REMOVAL Right 2011   KNEE ARTHROSCOPY Right 2017   clean out meniscus   KNEE ARTHROSCOPY Right 2015   cleaned out meniscus   KNEE ARTHROSCOPY Right    clean out meniscus   KNEE ARTHROSCOPY Right 12/2018   replaced broken hardware   KNEE ARTHROSCOPY Right 10/06/2020   partial medial meniscectomy   MEDIAL PARTIAL KNEE REPLACEMENT Right 2013   ORIF  2010   right knee; injury from work   OTHER SURGICAL HISTORY Right 1996   right elbow nerve decompression   POLYPECTOMY N/A 03/02/2021   Procedure: POLYPECTOMY;  Surgeon: Lucilla Lame, MD;  Location: [place];  Service: Endoscopy;  Laterality: N/A;   ULNAR NERVE TRANSPOSITION Right 05/15/2017   Procedure: Right elbow ulnar nerve decompression and/or transposition and nerve wrapping;  Surgeon: Iran Planas, MD;  Location: [place];  Service: Orthopedics;  Laterality: Right;  90 mins       Family History  Problem Relation Age of Onset   Heart disease Mother    Diabetes Father    Prostate cancer Neg Hx    Kidney cancer Neg Hx    Bladder Cancer Neg Hx     Social History   Tobacco Use   Smoking status: Never  Smokeless tobacco: Former    Types: Chew    Quit date: 12/2020  Vaping Use   Vaping Use: Never used  Substance Use Topics   Alcohol use: Yes    Alcohol/week: 4.0 standard drinks    Types: 4 Cans of beer per week   Drug use: No    Home Medications Prior to Admission medications   Medication Sig Start Date End Date Taking? Authorizing Provider  ciprofloxacin (CIPRO) 500 MG tablet Take 1 tablet (500 mg total) by mouth 2 (two) times daily. 03/03/21   Hollice Espy, MD  [employer] ASPIRIN LOW DOSE 81 MG EC tablet SMARTSIG:Tablet(s) By Mouth Patient not taking: Reported on 02/16/2021 10/04/20   [provider]  cyclobenzaprine (FLEXERIL) 5 MG tablet Take 1  tablet (5 mg total) by mouth 3 (three) times daily as needed for muscle spasms. 01/23/21   Pabon, Diego F, MD  metroNIDAZOLE (FLAGYL) 500 MG tablet Take 1 tablet (500 mg total) by mouth 3 (three) times daily. 03/03/21   Hollice Espy, MD  Na Sulfate-K Sulfate-Mg Sulf (SUPREP BOWEL PREP KIT) 17.5-3.13-1.6 GM/177ML SOLN Take 1 kit by mouth as directed. 02/01/21   Lucilla Lame, MD  ondansetron (ZOFRAN ODT) 4 MG disintegrating tablet Take 1 tablet (4 mg total) by mouth every 8 (eight) hours as needed for nausea or vomiting. Patient not taking: Reported on 02/16/2021 01/18/21   Hollice Espy, MD  tamsulosin (FLOMAX) 0.4 MG CAPS capsule Take 1 capsule (0.4 mg total) by mouth daily. 01/12/21   Debroah Loop, PA-C    Allergies    Patient has no known allergies.  Review of Systems   Review of Systems 10 systems reviewed and negative except as per HPI Physical Exam Updated Vital Signs BP 118/73 (BP Location: Right Arm)   Pulse (!) 101   Temp 99 F (37.2 C) (Oral)   Resp 18   SpO2 100%   Physical Exam Constitutional:      Appearance: Normal appearance.  HENT:     Head: Normocephalic and atraumatic.     Mouth/Throat:     Pharynx: Oropharynx is clear.  Eyes:     Extraocular Movements: Extraocular movements intact.  Cardiovascular:     Rate and Rhythm: Regular rhythm. Tachycardia present.  Pulmonary:     Effort: Pulmonary effort is normal.     Breath sounds: Normal breath sounds.  Abdominal:     Comments: Abdomen soft.  Mild focal suprapubic tenderness to palpation.  No CVA tenderness.  Musculoskeletal:        General: No swelling or tenderness. Normal range of motion.     Right lower leg: No edema.     Left lower leg: No edema.  Skin:    General: Skin is warm and dry.  Neurological:     General: No focal deficit present.     Mental Status: He is alert and oriented to person, place, and time.    ED Results / Procedures / Treatments   Labs (all labs ordered are listed, but  only abnormal results are displayed) Labs Reviewed  URINALYSIS, ROUTINE W REFLEX MICROSCOPIC - Abnormal; Notable for the following components:      Result Value   APPearance CLOUDY (*)    Hgb urine dipstick LARGE (*)    Protein, ur 30 (*)    Leukocytes,Ua LARGE (*)    WBC, UA >50 (*)    Bacteria, UA RARE (*)    All other components within normal limits  URINE CULTURE  CULTURE, BLOOD (ROUTINE  X 2)  CULTURE, BLOOD (ROUTINE X 2)  CBC WITH DIFFERENTIAL/PLATELET  COMPREHENSIVE METABOLIC PANEL  LIPASE, BLOOD  LACTIC ACID, PLASMA  LACTIC ACID, PLASMA    EKG None  Radiology No results found.  Procedures Procedures   Medications Ordered in ED Medications  sodium chloride 0.9 % bolus 1,000 mL (1,000 mLs Intravenous New Bag/Given 03/04/21 2025)    ED Course  I have reviewed the triage vital signs and the nursing notes.  Pertinent labs & imaging results that were available during my care of the patient were reviewed by me and considered in my medical decision making (see chart for details).    MDM Rules/Calculators/A&P                           Patient has history of complication of diverticulitis with a bladder fistula.  This has been evaluated and is in the process of being prepared for surgical repair.  In the interim patient has developed chills and fever.  Diagnostic evaluation does not suggest sepsis or acute complication at this time.  Cultures of blood and urine obtained.  Patient was given 1 IV dose of levofloxacin and prescription for ciprofloxacin.  He already has active prescription for Flagyl.  Patient is aware of return precautions and has close follow-up with gastroenterology and urology.  Cultures will be pending to rule out bacteremia. Final Clinical Impression(s) / ED Diagnoses Final diagnoses:  Bladder fistula  Chills    Rx / DC Orders ED Discharge Orders     None        Charlesetta Shanks, MD 03/08/21 1100

## 2021-03-04 NOTE — Discharge Instructions (Signed)
Follow-up with your urologist and gastroenterologist as planned. Start ciprofloxacin tomorrow evening.  You were given an IV dose of Levaquin in the emergency department.  You do not need the Cipro for 24 hours after you have completed your Levaquin.  Continue Flagyl as prescribed.  You may take 1-2 Vicodin as needed for pain.

## 2021-03-04 NOTE — ED Triage Notes (Addendum)
Patient had kidney pain yesterday.  Was told to start cipro and flagyl.  Patient developed a low grade temperature and teledoc appt instructed to come to ucc for urine test.    Patient had a colonoscopy thursday

## 2021-03-04 NOTE — ED Provider Notes (Signed)
Patient presents to clinic today after being advised to report to clinic from a telemedicine visit.  Patient reports a fever and urinary symptoms.  Urinalysis completed in clinic today, but as I entered the patient's room he reported that he had been advised to report to the emergency department instead of the urgent care setting given that he has a complicated history to include a fistula and has recently completed a colonoscopy.  No provider visit completed.  Patient to present to the emergency department via private vehicle.   Amalia Greenhouse, FNP 03/04/21 1540

## 2021-03-04 NOTE — ED Triage Notes (Signed)
Pt reports Thursday started having pain in bilateral lower back . 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 Pt has fever of 100. UTI dx at urgent care today. Pt started on cipro and flagyl per MD.

## 2021-03-04 NOTE — Progress Notes (Signed)
Virtual Visit via Video Note  I connected with Jeremy Owens on 03/04/21 at  2:30 PM EDT by a video enabled telemedicine application and verified that I am speaking with the correct person using two identifiers.  Location: Patient: Patient's home Provider: Provider's office  Person participating in the virtual visit: Patient and provider    I discussed the limitations of evaluation and management by telemedicine and the availability of in person appointments. The patient expressed understanding and agreed to proceed.  I discussed the assessment and treatment plan with the patient. The patient was provided an opportunity to ask questions and all were answered. The patient agreed with the plan and demonstrated an understanding of the instructions.   The patient was advised to call back or seek an in-person evaluation if the symptoms worsen or if the condition fails to improve as anticipated.  I provided 15 minutes of non-face-to-face time during this encounter.   Waldon Merl, PA-C   Subjective:    Patient ID: Jeremy Owens, male    DOB: [birth year], 45 y.o.   MRN: [id number]  No chief complaint on file.   45 yo M in NAD with PMH of diverticulitis, s/p colonoscopy 2 days ago, connects via video to inquire if he should go to the ER as instructed by his doctor. States he had the colonoscopy in preparation for sigmoid resection surgery scheduled in the next few weeks. States he developed abd pain, dysuria, urgency and back pain. States he connected with his doctor, who instructed him to start taking flagyl and Ciprofloxacin and was instructed that if he develops any fever, to go to the ER. States he did have 100.3 fever and asks if he should go the ER. States he has been taking since the two antibitoics since yesterday, and states his back pain and abdominal pain have resolved. States he has mild hematuria, but admits that is an ongoing symptom for him which he had since developing the  fistula. Denies any urinary urgency/frequency.  s Patient is in today to discuss if he should go to the ER after developing low grade fever- as instructed by his doctor.  Past Medical History:  Diagnosis Date   Diverticulitis    History of kidney stones    History of nephrolithiasis     Past Surgical History:  Procedure Laterality Date   COLONOSCOPY WITH PROPOFOL N/A 03/02/2021   Procedure: COLONOSCOPY WITH BIOPSY;  Surgeon: Lucilla Lame, MD;  Location: [place];  Service: Endoscopy;  Laterality: N/A;   CYSTOSCOPY WITH BIOPSY N/A 02/06/2021   Procedure: CYSTOSCOPY WITH BLADDER BIOPSY;  Surgeon: Hollice Espy, MD;  Location: [facility];  Service: Urology;  Laterality: N/A;   HARDWARE REMOVAL Right 2011   KNEE ARTHROSCOPY Right 2017   clean out meniscus   KNEE ARTHROSCOPY Right 2015   cleaned out meniscus   KNEE ARTHROSCOPY Right    clean out meniscus   KNEE ARTHROSCOPY Right 12/2018   replaced broken hardware   KNEE ARTHROSCOPY Right 10/06/2020   partial medial meniscectomy   MEDIAL PARTIAL KNEE REPLACEMENT Right 2013   ORIF  2010   right knee; injury from work   OTHER SURGICAL HISTORY Right 1996   right elbow nerve decompression   POLYPECTOMY N/A 03/02/2021   Procedure: POLYPECTOMY;  Surgeon: Lucilla Lame, MD;  Location: [place];  Service: Endoscopy;  Laterality: N/A;   ULNAR NERVE TRANSPOSITION Right 05/15/2017   Procedure: Right elbow ulnar nerve decompression and/or transposition and nerve wrapping;  Surgeon: Iran Planas, MD;  Location: [place];  Service: Orthopedics;  Laterality: Right;  90 mins    Family History  Problem Relation Age of Onset   Heart disease Mother    Diabetes Father    Prostate cancer Neg Hx    Kidney cancer Neg Hx    Bladder Cancer Neg Hx     Social History   Socioeconomic History   Marital status: Married    Spouse name: Caryl Pina   Number of children: 2   Years of education: Not on file   Highest education level: Not on  file  Occupational History   Not on file  Tobacco Use   Smoking status: Never   Smokeless tobacco: Former    Types: Chew    Quit date: 12/2020  Vaping Use   Vaping Use: Not on file  Substance and Sexual Activity   Alcohol use: Yes    Alcohol/week: 4.0 standard drinks    Types: 4 Cans of beer per week   Drug use: No   Sexual activity: Yes  Other Topics Concern   Not on file  Social History Narrative   Not on file   Social Determinants of Health   Financial Resource Strain: Not on file  Food Insecurity: Not on file  Transportation Needs: Not on file  Physical Activity: Not on file  Stress: Not on file  Social Connections: Not on file  Intimate Partner Violence: Not on file    Outpatient Medications Prior to Visit  Medication Sig Dispense Refill   ciprofloxacin (CIPRO) 500 MG tablet Take 1 tablet (500 mg total) by mouth 2 (two) times daily. 10 tablet 0   [employer]S ASPIRIN LOW DOSE 81 MG EC tablet SMARTSIG:Tablet(s) By Mouth (Patient not taking: Reported on 02/16/2021)     cyclobenzaprine (FLEXERIL) 5 MG tablet Take 1 tablet (5 mg total) by mouth 3 (three) times daily as needed for muscle spasms. 30 tablet 0   metroNIDAZOLE (FLAGYL) 500 MG tablet Take 1 tablet (500 mg total) by mouth 3 (three) times daily. 15 tablet 0   Na Sulfate-K Sulfate-Mg Sulf (SUPREP BOWEL PREP KIT) 17.5-3.13-1.6 GM/177ML SOLN Take 1 kit by mouth as directed. 354 mL 0   ondansetron (ZOFRAN ODT) 4 MG disintegrating tablet Take 1 tablet (4 mg total) by mouth every 8 (eight) hours as needed for nausea or vomiting. (Patient not taking: Reported on 02/16/2021) 20 tablet 0   tamsulosin (FLOMAX) 0.4 MG CAPS capsule Take 1 capsule (0.4 mg total) by mouth daily. 30 capsule 0   No facility-administered medications prior to visit.    No Known Allergies  Review of Systems  Constitutional:  Positive for fever. Negative for activity change, appetite change, chills, diaphoresis and fatigue.  Respiratory:  Negative for  chest tightness and shortness of breath.   Cardiovascular:  Negative for chest pain.  Gastrointestinal:  Negative for abdominal distention, abdominal pain, diarrhea, nausea and vomiting.  Genitourinary:  Positive for dysuria and hematuria. Negative for decreased urine volume, difficulty urinating, enuresis, flank pain, frequency, genital sores and urgency.  Musculoskeletal:  Negative for arthralgias and back pain.  Skin:  Negative for rash.  Neurological:  Negative for dizziness.  Hematological:  Negative for adenopathy.  Psychiatric/Behavioral:  Negative for agitation, behavioral problems and confusion.       Objective:    Physical Exam Constitutional:      General: He is not in acute distress.    Appearance: Normal appearance. He is not ill-appearing, toxic-appearing or diaphoretic.  Pulmonary:     Effort: No respiratory distress.  Neurological:     Mental Status: He is alert and oriented to person, place, and time.  Psychiatric:        Mood and Affect: Mood normal.        Behavior: Behavior normal.        Thought Content: Thought content normal.        Judgment: Judgment normal.    There were no vitals taken for this visit. Wt Readings from Last 3 Encounters:  03/02/21 209 lb (94.8 kg)  02/09/21 214 lb 9.6 oz (97.3 kg)  02/02/21 215 lb (97.5 kg)    Health Maintenance Due  Topic Date Due   COVID-19 Vaccine (1) Never done   HIV Screening  Never done   Hepatitis C Screening  Never done   INFLUENZA VACCINE  Never done    There are no preventive care reminders to display for this patient.   Lab Results  Component Value Date   TSH 1.990 02/02/2021   Lab Results  Component Value Date   WBC 4.3 02/02/2021   HGB 16.8 02/02/2021   HCT 49.7 02/02/2021   MCV 93 02/02/2021   PLT 224 02/02/2021   Lab Results  Component Value Date   NA 138 02/02/2021   K 4.4 02/02/2021   CO2 27 04/26/2020   GLUCOSE 98 02/02/2021   BUN 10 02/02/2021   CREATININE 1.00 02/02/2021    BILITOT 0.4 02/02/2021   ALKPHOS 56 02/02/2021   AST 23 02/02/2021   ALT 28 02/02/2021   PROT 7.6 02/02/2021   ALBUMIN 4.4 02/02/2021   CALCIUM 9.6 02/02/2021   ANIONGAP 9 04/26/2020   EGFR 96 02/02/2021   Lab Results  Component Value Date   CHOL 151 02/02/2021   Lab Results  Component Value Date   HDL 44 02/02/2021   Lab Results  Component Value Date   LDLCALC 91 02/02/2021   Lab Results  Component Value Date   TRIG 86 02/02/2021   Lab Results  Component Value Date   CHOLHDL 3.4 02/02/2021   Lab Results  Component Value Date   HGBA1C 5.1 02/02/2021       Assessment & Plan:   Problem List Items Addressed This Visit   None    No orders of the defined types were placed in this encounter.   Discussed with patient following: Fever in setting of urinary sxs may indicated complicated infection such as pyelonephritis- he will need to go to urgent care for labs and thorough exam (although he states his back and abd pain have resolved) advised if he is unable to  go to urgent care, as they maybe closed or dont take walking,  then he will need to go to the ER. He has been advised that if he develops or current has any abd pain (althoug he denies any abd pain) go to go the ER  as this maybe a complication of his diverticultis/colonscopy procedure he received. He voiced understaing  Add: called pt - no answer- wanted to inform him that it maybe best to go to the ER since he maybe sent to ER from the UC due to his recent hx of colonscopy and to rule out any acute findings on abdomen.   Sahar M Osman, PA-C  

## 2021-03-04 NOTE — ED Notes (Signed)
Pt verbalizes understanding of discharge instructions. Opportunity for questioning and answers were provided. Armand removed by staff, pt discharged from ED to home. Educated to pick up Rx.  

## 2021-03-04 NOTE — ED Notes (Signed)
Patient is being discharged from the Urgent Care and sent to the Emergency Department via pov . Per Boddu, pa, patient is in need of higher level of care due to history, fever, tachycardia, uti. Patient is aware and verbalizes understanding of plan of care.  Vitals:   03/04/21 1522  BP: 135/83  Pulse: (!) 114  Resp: 20  Temp: (!) 100.8 F (38.2 C)  SpO2: 97%

## 2021-03-05 ENCOUNTER — Encounter: Payer: Self-pay | Admitting: Gastroenterology

## 2021-03-06 LAB — URINE CULTURE: Culture: <10000 — AB

## 2021-03-08 ENCOUNTER — Other Ambulatory Visit: Payer: Self-pay

## 2021-03-08 ENCOUNTER — Ambulatory Visit: Payer: BC Managed Care – PPO | Admitting: Surgery

## 2021-03-08 ENCOUNTER — Encounter: Payer: Self-pay | Admitting: Surgery

## 2021-03-08 VITALS — BP 122/77 | HR 79 | Temp 98.5°F | Ht 72.0 in | Wt 209.0 lb

## 2021-03-08 DIAGNOSIS — N321 Vesicointestinal fistula: Secondary | ICD-10-CM | POA: Diagnosis not present

## 2021-03-08 MED ORDER — BISACODYL 5 MG PO TBEC: DELAYED_RELEASE_TABLET | ORAL | 0 refills | Status: DC

## 2021-03-08 MED ORDER — METRONIDAZOLE 500 MG PO TABS: ORAL_TABLET | ORAL | 0 refills | Status: DC

## 2021-03-08 MED ORDER — POLYETHYLENE GLYCOL 3350 17 GM/SCOOP PO POWD: ORAL | 0 refills | Status: DC

## 2021-03-08 MED ORDER — NEOMYCIN SULFATE 500 MG PO TABS: ORAL_TABLET | ORAL | 0 refills | Status: DC

## 2021-03-08 NOTE — Patient Instructions (Signed)
We have discussed removing a portion of your damaged colon through 4 small incisions today. We have scheduled this surgery at [facility] with Dr. Pabon. Please plan a hospital stay of 3-7 days for surgery and recovery time. ° °We will have you complete a bowel prep prior to your surgery. Please see information provided. You have also been given a (Blue) Pre-Care Sheet with more information regarding your particular surgery. Please review all information given. ° °Please call our office with any questions or concerns prior to your scheduled surgery. ° ° °Laparoscopic Colectomy °Laparoscopic colectomy is surgery to remove part or all of the large intestine (colon). This procedure may be used to treat several conditions, including: °· Inflammation and infection of the colon (diverticulitis). °· Tumors or masses in the colon. °· Inflammatory bowel disease, such as Crohn disease or ulcerative colitis. Colectomy is an option when symptoms cannot be controlled with medicines. °· Bleeding from the colon that cannot be controlled by another method. °· Blockage or obstruction of the colon. ° °Tell a health care provider about: °· Any allergies you have. °· All medicines you are taking, including vitamins, herbs, eye drops, creams, and over-the-counter medicines. °· Any problems you or family members have had with anesthetic medicines. °· Any blood disorders you have. °· Any surgeries you have had. °· Any medical conditions you have. °What are the risks? °Generally, this is a safe procedure. However, problems may occur, including: °· Infection. °· Bleeding. °· Allergic reactions to medicines or dyes. °· Damage to other structures or organs. °· Leaking from where the colon was sewn together. °· Future blockage of the small intestines from scar tissue. Another surgery may be needed to repair this. °· Needing to convert to an open procedure. Complications such as damage to other organs or excessive bleeding may require the surgeon to  convert from a laparoscopic procedure to an open procedure. This involves making a larger incision in the abdomen. ° °What happens before the procedure? °Staying hydrated °Follow instructions from your health care provider about hydration, which may include: °· Up to 2 hours before the procedure - you may continue to drink clear liquids, such as water, clear fruit juice, black coffee, and plain tea. ° °Eating and drinking restrictions °Follow instructions from your health care provider about eating and drinking, which may include: °· 8 hours before the procedure - stop eating heavy meals, meals with high fiber, or foods such as meat, fried foods, or fatty foods. °· 6 hours before the procedure - stop eating light meals or foods, such as toast or cereal. °· 6 hours before the procedure - stop drinking milk or drinks that contain milk. °· 2 hours before the procedure - stop drinking clear liquids. ° °Medicines °· Ask your health care provider about: °? Changing or stopping your regular medicines. This is especially important if you are taking diabetes medicines or blood thinners. °? Taking medicines such as aspirin and ibuprofen. These medicines can thin your blood. Do not take these medicines before your procedure if your health care provider instructs you not to. °· You may be given antibiotic medicine to clean out bacteria from your colon. Follow the directions carefully and take the medicine at the correct time. °General instructions °· You may be prescribed an oral bowel prep to clean out your colon in preparation for the surgery: °? Follow instructions from your health care provider about how to do this. °? Do not eat or drink anything else after you have   started the bowel prep, unless your health care provider tells you it is safe to do so. °· Do not use any products that contain nicotine or tobacco, such as cigarettes and e-cigarettes. If you need help quitting, ask your health care provider. °What happens  during the procedure? °· To reduce your risk of infection: °? Your health care team will wash or sanitize their hands. °? Your skin will be washed with soap. °· An IV tube will be inserted into one of your veins to deliver fluid and medication. °· You will be given one of the following: °? A medicine to help you relax (sedative). °? A medicine to make you fall asleep (general anesthetic). °· Small monitors will be connected to your body. They will be used to check your heart, blood pressure, and oxygen level. °· A breathing tube may be placed into your lungs during the procedure. °· A thin, flexible tube (catheter) will be placed into your bladder to drain urine. °· A tube may be placed through your nose and into your stomach to drain stomach fluids (nasogastric tube, or NG tube). °· Your abdomen will be filled with air so it expands. This gives the surgeon more room to operate and makes your organs easier to see. °· Several small cuts (incisions) will be made in your abdomen. °· A thin, lighted tube with a tiny camera on the end (laparoscope) will be put through one of the small incisions. The camera on the laparoscope will send a picture to a computer screen in the operating room. This will give the surgeon a good view inside your abdomen. °· Hollow tubes will be put through the other small incisions in your abdomen. The tools that are needed for the procedure will be put through these tubes. °· Clamps or staples will be put on both ends of the diseased part of the colon. °· The part of the intestine between the clamps or staples will be removed. °· If possible, the ends of the healthy colon that remain will be stitched (sutured) or stapled together to allow your body to pass waste (stool). °· Sometimes, the remaining colon cannot be stitched back together. If this is the case, a colostomy will be needed. If you need a colostomy: °? An opening to the outside of your body (stoma) will be made through your  abdomen. °? The end of your colon will be brought to the opening. It will be stitched to the skin. °? A bag will be attached to the opening. Stool will drain into this removable bag. °? The colostomy may be temporary or permanent. °· The incisions from the colectomy will be closed with sutures or staples. °The procedure may vary among health care providers and hospitals. °What happens after the procedure? °· Your blood pressure, heart rate, breathing rate, and blood oxygen level will be monitored until the medicines you were given have worn off. °· You will receive fluids through an IV tube until your bowels start to work properly. °· Once your bowels are working again, you will be given clear liquids first and then solid food as tolerated. °· You will be given medicines to control your pain and nausea, if needed. °· Do not drive for 24 hours if you were given a sedative. °This information is not intended to replace advice given to you by your health care provider. Make sure you discuss any questions you have with your health care provider. °Document Released: 09/08/2002 Document Revised: 03/19/2016 Document Reviewed:   03/19/2016 °Elsevier Interactive Patient Education © 2018 [employer] ° ° ° ° °Laparoscopic Colectomy, Care After °This sheet gives you information about how to care for yourself after your procedure. Your health care provider may also give you more specific instructions. If you have problems or questions, contact your health care provider. °What can I expect after the procedure? °After your procedure, it is common to have the following: °· Pain in your abdomen, especially in the incision areas. You will be given medicine to control the pain. °· Tiredness. This is a normal part of the recovery process. Your energy level will return to normal over the next several weeks. °· Changes in your bowel movements, such as constipation or needing to go more often. Talk with your health care provider about how  to manage this. ° °Follow these instructions at home: °Medicines °· Take over-the-counter and prescription medicines only as told by your health care provider. °· Do not drive or use heavy machinery while taking prescription pain medicine. °· Do not drink alcohol while taking prescription pain medicine. °· If you were prescribed an antibiotic medicine, use it as told by your health care provider. Do not stop using the antibiotic even if you start to feel better. °Incision care °· Follow instructions from your health care provider about how to take care of your incision areas. Make sure you: °? Keep your incisions clean and dry. °? Wash your hands with soap and water before and after applying medicine to the areas, and before and after changing your bandage (dressing). If soap and water are not available, use hand sanitizer. °? Change your dressing as told by your health care provider. °? Leave stitches (sutures), skin glue, or adhesive strips in place. These skin closures may need to stay in place for 2 weeks or longer. If adhesive strip edges start to loosen and curl up, you may trim the loose edges. Do not remove adhesive strips completely unless your health care provider tells you to do that. °· Do not wear tight clothing over the incisions. Tight clothing may rub and irritate the incision areas, which may cause the incisions to open. °· Do not take baths, swim, or use a hot tub until your health care provider approves. Ask your health care provider if you can take showers. You may only be allowed to take sponge baths for bathing. °· Check your incision area every day for signs of infection. Check for: °? More redness, swelling, or pain. °? More fluid or blood. °? Warmth. °? Pus or a bad smell. °Activity °· Avoid lifting anything that is heavier than 10 lb (4.5 kg) for 2 weeks or until your health care provider says it is okay. °· You may resume normal activities as told by your health care provider. Ask your  health care provider what activities are safe for you. °· Take rest breaks during the day as needed. °Eating and drinking °· Follow instructions from your health care provider about what you can eat after surgery. °· To prevent or treat constipation while you are taking prescription pain medicine, your health care provider may recommend that you: °? Drink enough fluid to keep your urine clear or pale yellow. °? Take over-the-counter or prescription medicines. °? Eat foods that are high in fiber, such as fresh fruits and vegetables, whole grains, and beans. °? Limit foods that are high in fat and processed sugars, such as fried and sweet foods. °General instructions °· Ask your health care provider when   you will need an appointment to get your sutures or staples removed. °· Keep all follow-up visits as told by your health care provider. This is important. °Contact a health care provider if: °· You have more redness, swelling, or pain around your incisions. °· You have more fluid or blood coming from the incisions. °· Your incisions feel warm to the touch. °· You have pus or a bad smell coming from your incisions or your dressing. °· You have a fever. °· You have an incision that breaks open (edges not staying together) after sutures or staples have been removed. °Get help right away if: °· You develop a rash. °· You have chest pain or difficulty breathing. °· You have pain or swelling in your legs. °· You feel light-headed or you faint. °· Your abdomen swells (becomes distended). °· You have nausea or vomiting. °· You have blood in your stool (feces). °This information is not intended to replace advice given to you by your health care provider. Make sure you discuss any questions you have with your health care provider. °Document Released: 01/05/2005 Document Revised: 03/19/2016 Document Reviewed: 03/19/2016 °Elsevier Interactive Patient Education © 2018 [employer] ° °

## 2021-03-09 ENCOUNTER — Telehealth: Payer: Self-pay | Admitting: Surgery

## 2021-03-09 LAB — CULTURE, BLOOD (ROUTINE X 2)
Culture: NO GROWTH
Culture: NO GROWTH
Special Requests: ADEQUATE
Special Requests: ADEQUATE

## 2021-03-09 NOTE — Telephone Encounter (Signed)
Patient has been advised of Pre-Admission date/time, COVID Testing date and Surgery date.  Surgery Date: 03/30/21 Preadmission Testing Date: 03/20/21 (phone 1p-5p) Covid Testing Date: 03/28/21 @ 8:35 am - patient advised to [facility] to [facility] ([address] [facility]) between 8a-12:00 pm  Patient has been made aware to call [phone], between 1-3:00pm the day before surgery, to find out what time to arrive for surgery.

## 2021-03-10 NOTE — H&P (View-Only) (Signed)
Outpatient Surgical Follow Up  03/10/2021  Jeremy Owens is an 45 y.o. male.   No chief complaint on file.   HPI: Jeremy Owens is a 45 year old male following for a colovesical fistula.  He is with very well-known to me and had recently had a repeat cystoscopy showing no evidence of cancer and persistent fistula.  He also had a completion colonoscopy showing no evidence of cancer.  He did have a repeat CT scan that I have personally reviewed again.  There is evidence of diverticulitis but no evidence of a drainable abscess or any other surprises.  He does have slightly enlarged spleen but I do not consider that this is a contraindication for sigmoid colectomy.  He is a Emergency planning/management officer at [employer].  He is able to perform more than 4 METS of activity without any shortness of breath or chest pain.  He is looking forward for definitive repair of his colovesical fistula.  Currently he reports some intermittent abdominal pains and burning with urination.  He denies any fevers any chills and is tolerating diet well.  Past Medical History:  Diagnosis Date   Diverticulitis    History of kidney stones    History of nephrolithiasis     Past Surgical History:  Procedure Laterality Date   COLONOSCOPY WITH PROPOFOL N/A 03/02/2021   Procedure: COLONOSCOPY WITH BIOPSY;  Surgeon: Midge Minium, MD;  Location: [facility];  Service: Endoscopy;  Laterality: N/A;   CYSTOSCOPY WITH BIOPSY N/A 02/06/2021   Procedure: CYSTOSCOPY WITH BLADDER BIOPSY;  Surgeon: Vanna Scotland, MD;  Location: [facility];  Service: Urology;  Laterality: N/A;   HARDWARE REMOVAL Right 2011   KNEE ARTHROSCOPY Right 2017   clean out meniscus   KNEE ARTHROSCOPY Right 2015   cleaned out meniscus   KNEE ARTHROSCOPY Right    clean out meniscus   KNEE ARTHROSCOPY Right 12/2018   replaced broken hardware   KNEE ARTHROSCOPY Right 10/06/2020   partial medial meniscectomy   MEDIAL PARTIAL KNEE REPLACEMENT Right 2013   ORIF  2010   right  knee; injury from work   OTHER SURGICAL HISTORY Right 1996   right elbow nerve decompression   POLYPECTOMY N/A 03/02/2021   Procedure: POLYPECTOMY;  Surgeon: Midge Minium, MD;  Location: [facility];  Service: Endoscopy;  Laterality: N/A;   ULNAR NERVE TRANSPOSITION Right 05/15/2017   Procedure: Right elbow ulnar nerve decompression and/or transposition and nerve wrapping;  Surgeon: Bradly Bienenstock, MD;  Location: [facility];  Service: Orthopedics;  Laterality: Right;  90 mins    Family History  Problem Relation Age of Onset   Heart disease Mother    Diabetes Father    Prostate cancer Neg Hx    Kidney cancer Neg Hx    Bladder Cancer Neg Hx     Social History:  reports that he has never smoked. He quit smokeless tobacco use about 2 months ago.  His smokeless tobacco use included chew. He reports current alcohol use of about 4.0 standard drinks per week. He reports that he does not use drugs.  Allergies: No Known Allergies  Medications reviewed.    ROS Full ROS performed and is otherwise negative other than what is stated in HPI   BP 122/77   Pulse 79   Temp 98.5 F (36.9 C)   Ht 6' (1.829 m)   Wt 209 lb (94.8 kg)   SpO2 97%   BMI 28.35 kg/m   Physical Exam Vitals and nursing note reviewed. Exam  conducted with a chaperone present.  Constitutional:      General: He is not in acute distress.    Appearance: Normal appearance. He is normal weight.  Eyes:     General: No scleral icterus.       Right eye: No discharge.        Left eye: No discharge.  Cardiovascular:     Rate and Rhythm: Normal rate and regular rhythm.     Heart sounds: No murmur heard.   No friction rub. No gallop.  Pulmonary:     Effort: Pulmonary effort is normal. No respiratory distress.     Breath sounds: Normal breath sounds. No stridor. No wheezing.  Abdominal:     General: Abdomen is flat. There is no distension.     Palpations: Abdomen is soft. There is no mass.     Tenderness: There is  abdominal tenderness. There is no guarding or rebound.     Hernia: No hernia is present.     Comments: Suprapubic tenderness w/o peritonitis  Musculoskeletal:        General: No swelling or tenderness. Normal range of motion.     Cervical back: Normal range of motion and neck supple. No rigidity or tenderness.  Lymphadenopathy:     Cervical: No cervical adenopathy.  Skin:    General: Skin is warm and dry.     Capillary Refill: Capillary refill takes less than 2 seconds.     Coloration: Skin is not jaundiced.  Neurological:     General: No focal deficit present.     Mental Status: He is alert and oriented to person, place, and time.  Psychiatric:        Mood and Affect: Mood normal.        Behavior: Behavior normal.        Thought Content: Thought content normal.        Judgment: Judgment normal.        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  I do think that sigmoid colectomy is indicated at this time.  I had an extensive discussion with the patient about the procedure.  I do think that he is a very good candidate for robotic approach.  I have given him a couple of dates so he can choose.   Given fistula we will ask GU to place ICG within ureters and might be available in case more extensive bladder repair is required. Procedure discussed with the patient in detail.  Risks, benefits and possible complications including but not limited to: Bleeding, infection, ureter injuries, possibility of stoma and leak.  Also discussed with the patient detail about appropriate staging and that he may require adjuvant chemotherapy depending on operative pathology. 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 Greater than 50% of the 40 minutes  visit was spent in counseling/coordination of care   Sterling Big, MD [facility] General Surgeon

## 2021-03-10 NOTE — Progress Notes (Signed)
Outpatient Surgical Follow Up  03/10/2021  Jeremy Owens is an 45 y.o. male.   No chief complaint on file.   HPI: Jeremy Owens is a 45 year old male following for a colovesical fistula.  He is with very well-known to me and had recently had a repeat cystoscopy showing no evidence of cancer and persistent fistula.  He also had a completion colonoscopy showing no evidence of cancer.  He did have a repeat CT scan that I have personally reviewed again.  There is evidence of diverticulitis but no evidence of a drainable abscess or any other surprises.  He does have slightly enlarged spleen but I do not consider that this is a contraindication for sigmoid colectomy.  He is a Emergency planning/management officer at [employer].  He is able to perform more than 4 METS of activity without any shortness of breath or chest pain.  He is looking forward for definitive repair of his colovesical fistula.  Currently he reports some intermittent abdominal pains and burning with urination.  He denies any fevers any chills and is tolerating diet well.  Past Medical History:  Diagnosis Date   Diverticulitis    History of kidney stones    History of nephrolithiasis     Past Surgical History:  Procedure Laterality Date   COLONOSCOPY WITH PROPOFOL N/A 03/02/2021   Procedure: COLONOSCOPY WITH BIOPSY;  Surgeon: Midge Minium, MD;  Location: [facility];  Service: Endoscopy;  Laterality: N/A;   CYSTOSCOPY WITH BIOPSY N/A 02/06/2021   Procedure: CYSTOSCOPY WITH BLADDER BIOPSY;  Surgeon: Vanna Scotland, MD;  Location: [facility];  Service: Urology;  Laterality: N/A;   HARDWARE REMOVAL Right 2011   KNEE ARTHROSCOPY Right 2017   clean out meniscus   KNEE ARTHROSCOPY Right 2015   cleaned out meniscus   KNEE ARTHROSCOPY Right    clean out meniscus   KNEE ARTHROSCOPY Right 12/2018   replaced broken hardware   KNEE ARTHROSCOPY Right 10/06/2020   partial medial meniscectomy   MEDIAL PARTIAL KNEE REPLACEMENT Right 2013   ORIF  2010   right  knee; injury from work   OTHER SURGICAL HISTORY Right 1996   right elbow nerve decompression   POLYPECTOMY N/A 03/02/2021   Procedure: POLYPECTOMY;  Surgeon: Midge Minium, MD;  Location: [facility];  Service: Endoscopy;  Laterality: N/A;   ULNAR NERVE TRANSPOSITION Right 05/15/2017   Procedure: Right elbow ulnar nerve decompression and/or transposition and nerve wrapping;  Surgeon: Bradly Bienenstock, MD;  Location: [facility] OR;  Service: Orthopedics;  Laterality: Right;  90 mins    Family History  Problem Relation Age of Onset   Heart disease Mother    Diabetes Father    Prostate cancer Neg Hx    Kidney cancer Neg Hx    Bladder Cancer Neg Hx     Social History:  reports that he has never smoked. He quit smokeless tobacco use about 2 months ago.  His smokeless tobacco use included chew. He reports current alcohol use of about 4.0 standard drinks per week. He reports that he does not use drugs.  Allergies: No Known Allergies  Medications reviewed.    ROS Full ROS performed and is otherwise negative other than what is stated in HPI   BP 122/77   Pulse 79   Temp 98.5 F (36.9 C)   Ht 6' (1.829 m)   Wt 209 lb (94.8 kg)   SpO2 97%   BMI 28.35 kg/m   Physical Exam Vitals and nursing note reviewed. Exam  conducted with a chaperone present.  Constitutional:      General: He is not in acute distress.    Appearance: Normal appearance. He is normal weight.  Eyes:     General: No scleral icterus.       Right eye: No discharge.        Left eye: No discharge.  Cardiovascular:     Rate and Rhythm: Normal rate and regular rhythm.     Heart sounds: No murmur heard.   No friction rub. No gallop.  Pulmonary:     Effort: Pulmonary effort is normal. No respiratory distress.     Breath sounds: Normal breath sounds. No stridor. No wheezing.  Abdominal:     General: Abdomen is flat. There is no distension.     Palpations: Abdomen is soft. There is no mass.     Tenderness: There is  abdominal tenderness. There is no guarding or rebound.     Hernia: No hernia is present.     Comments: Suprapubic tenderness w/o peritonitis  Musculoskeletal:        General: No swelling or tenderness. Normal range of motion.     Cervical back: Normal range of motion and neck supple. No rigidity or tenderness.  Lymphadenopathy:     Cervical: No cervical adenopathy.  Skin:    General: Skin is warm and dry.     Capillary Refill: Capillary refill takes less than 2 seconds.     Coloration: Skin is not jaundiced.  Neurological:     General: No focal deficit present.     Mental Status: He is alert and oriented to person, place, and time.  Psychiatric:        Mood and Affect: Mood normal.        Behavior: Behavior normal.        Thought Content: Thought content normal.        Judgment: Judgment normal.        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  I do think that sigmoid colectomy is indicated at this time.  I had an extensive discussion with the patient about the procedure.  I do think that he is a very good candidate for robotic approach.  I have given him a couple of dates so he can choose.   Given fistula we will ask GU to place ICG within ureters and might be available in case more extensive bladder repair is required. Procedure discussed with the patient in detail.  Risks, benefits and possible complications including but not limited to: Bleeding, infection, ureter injuries, possibility of stoma and leak.  Also discussed with the patient detail about appropriate staging and that he may require adjuvant chemotherapy depending on operative pathology. He expressed understanding and is very appreciative. Greater than 50% of the 40 minutes  visit was spent in counseling/coordination of care   Jeremy Matlack, MD FACS General Surgeon  

## 2021-03-20 ENCOUNTER — Other Ambulatory Visit
Admission: RE | Admit: 2021-03-20 | Discharge: 2021-03-20 | Disposition: A | Discharge status: Home / Self Care | Payer: BC Managed Care – PPO | Source: Ambulatory Visit | Attending: Surgery | Admitting: Surgery

## 2021-03-20 NOTE — Patient Instructions (Addendum)
Your procedure is scheduled on: 03/30/21 - THURSDAY Report to the Registration Desk on the 1st floor of the [facility]. To find out your arrival time, please call [phone] between 1PM - 3PM on: 03/29/21 - Wednesday Report to [facility] on 03/28/21 for Covid Test AND LAB 9:00.  REMEMBER: Instructions that are not followed completely may result in serious medical risk, up to and including death; or upon the discretion of your surgeon and anesthesiologist your surgery may need to be rescheduled.  FOLLOW BOWEL PREP INSTRUCTIONS GIVEN TO YOU BY DR.PABON'S OFFICE,  TAKE MEDICATIONS AS PRESCRIBED TO YOU BY DR.PABON'S OFFICE.  One week prior to surgery: Stop Anti-inflammatories (NSAIDS) such as Advil, Aleve, Ibuprofen, Motrin, Naproxen, Naprosyn and Aspirin based products such as Excedrin, Goodys Powder, BC Powder.  Stop ANY OVER THE COUNTER supplements until after surgery.  You may take Tylenol if needed for pain up until the day of surgery.  No Alcohol for 24 hours before or after surgery.  No Smoking including e-cigarettes for 24 hours prior to surgery.  No chewable tobacco products for at least 6 hours prior to surgery.  No nicotine patches on the day of surgery.  Do not use any "recreational" drugs for at least a week prior to your surgery.  Please be advised that the combination of cocaine and anesthesia may have negative outcomes, up to and including death. If you test positive for cocaine, your surgery will be cancelled.  On the morning of surgery brush your teeth with toothpaste and water, you may rinse your mouth with mouthwash if you wish. Do not swallow any toothpaste or mouthwash.  Use CHG Soap or wipes as directed on instruction sheet.  Do not wear jewelry, make-up, hairpins, clips or nail polish.  Do not wear lotions, powders, or perfumes.   Do not shave body from the neck down 48 hours prior to surgery just in case you cut yourself which could leave a  site for infection.  Also, freshly shaved skin may become irritated if using the CHG soap.  Contact lenses, hearing aids and dentures may not be worn into surgery.  Do not bring valuables to the hospital. [facility] is not responsible for any missing/lost belongings or valuables.   Fleets enema or bowel prep as directed.  Notify your doctor if there is any change in your medical condition (cold, fever, infection).  Wear comfortable clothing (specific to your surgery type) to the hospital.  After surgery, you can help prevent lung complications by doing breathing exercises.  Take deep breaths and cough every 1-2 hours. Your doctor may order a device called an Incentive Spirometer to help you take deep breaths. When coughing or sneezing, hold a pillow firmly against your incision with both hands. This is called "splinting." Doing this helps protect your incision. It also decreases belly discomfort.  If you are being admitted to the hospital overnight, leave your suitcase in the car. After surgery it may be brought to your room.  If you are being discharged the day of surgery, you will not be allowed to drive home. You will need a responsible adult (18 years or older) to drive you home and stay with you that night.   If you are taking public transportation, you will need to have a responsible adult (18 years or older) with you. Please confirm with your physician that it is acceptable to use public transportation.   Please call the Pre-admissions Testing Dept. at [phone] if you  have any questions about these instructions.  Surgery Visitation Policy:  Patients undergoing a surgery or procedure may have one family member or support person with them as long as that person is not COVID-19 positive or experiencing its symptoms.  That person may remain in the waiting area during the procedure and may rotate out with other people.  Inpatient Visitation:    Visiting hours are 7 a.m. to  8 p.m. Up to two visitors ages 16+ are allowed at one time in a patient room. The visitors may rotate out with other people during the day. Visitors must check out when they leave, or other visitors will not be allowed. One designated support person may remain overnight. The visitor must pass COVID-19 screenings, use hand sanitizer when entering and exiting the patient's room and wear a mask at all times, including in the patient's room. Patients must also wear a mask when staff or their visitor are in the room. Masking is required regardless of vaccination status.

## 2021-03-28 ENCOUNTER — Other Ambulatory Visit: Payer: Self-pay

## 2021-03-28 ENCOUNTER — Other Ambulatory Visit
Admission: RE | Admit: 2021-03-28 | Discharge: 2021-03-28 | Disposition: A | Discharge status: Home / Self Care | Payer: BC Managed Care – PPO | Source: Ambulatory Visit | Attending: Surgery | Admitting: Surgery

## 2021-03-28 DIAGNOSIS — Z8616 Personal history of COVID-19: Secondary | ICD-10-CM | POA: Diagnosis not present

## 2021-03-28 DIAGNOSIS — N321 Vesicointestinal fistula: Secondary | ICD-10-CM | POA: Diagnosis not present

## 2021-03-28 DIAGNOSIS — Z96651 Presence of right artificial knee joint: Secondary | ICD-10-CM | POA: Diagnosis not present

## 2021-03-28 DIAGNOSIS — Z20822 Contact with and (suspected) exposure to covid-19: Secondary | ICD-10-CM | POA: Insufficient documentation

## 2021-03-28 DIAGNOSIS — R161 Splenomegaly, not elsewhere classified: Secondary | ICD-10-CM | POA: Diagnosis not present

## 2021-03-28 DIAGNOSIS — Z01812 Encounter for preprocedural laboratory examination: Secondary | ICD-10-CM | POA: Insufficient documentation

## 2021-03-28 DIAGNOSIS — Z87891 Personal history of nicotine dependence: Secondary | ICD-10-CM | POA: Diagnosis not present

## 2021-03-28 DIAGNOSIS — Z87442 Personal history of urinary calculi: Secondary | ICD-10-CM | POA: Diagnosis not present

## 2021-03-28 DIAGNOSIS — Z833 Family history of diabetes mellitus: Secondary | ICD-10-CM | POA: Diagnosis not present

## 2021-03-28 DIAGNOSIS — K632 Fistula of intestine: Secondary | ICD-10-CM | POA: Diagnosis not present

## 2021-03-28 DIAGNOSIS — K5792 Diverticulitis of intestine, part unspecified, without perforation or abscess without bleeding: Secondary | ICD-10-CM | POA: Diagnosis not present

## 2021-03-28 DIAGNOSIS — K660 Peritoneal adhesions (postprocedural) (postinfection): Secondary | ICD-10-CM | POA: Diagnosis not present

## 2021-03-28 DIAGNOSIS — K5720 Diverticulitis of large intestine with perforation and abscess without bleeding: Secondary | ICD-10-CM | POA: Diagnosis not present

## 2021-03-28 DIAGNOSIS — Z8249 Family history of ischemic heart disease and other diseases of the circulatory system: Secondary | ICD-10-CM | POA: Diagnosis not present

## 2021-03-28 DIAGNOSIS — Z87440 Personal history of urinary (tract) infections: Secondary | ICD-10-CM | POA: Diagnosis not present

## 2021-03-28 DIAGNOSIS — K5732 Diverticulitis of large intestine without perforation or abscess without bleeding: Secondary | ICD-10-CM | POA: Diagnosis not present

## 2021-03-28 LAB — TYPE AND SCREEN
ABO/RH(D): O NEG
Antibody Screen: NEGATIVE

## 2021-03-29 LAB — SARS CORONAVIRUS 2 (TAT 6-24 HRS): SARS Coronavirus 2: NEGATIVE

## 2021-03-29 MED ORDER — CHLORHEXIDINE GLUCONATE CLOTH 2 % EX PADS: 6.0000 | MEDICATED_PAD | Freq: Once | CUTANEOUS | Status: DC

## 2021-03-29 MED ORDER — GABAPENTIN 300 MG PO CAPS: 300.0000 mg | ORAL_CAPSULE | ORAL | Status: AC

## 2021-03-29 MED ORDER — FAMOTIDINE 20 MG PO TABS: 20.0000 mg | ORAL_TABLET | Freq: Once | ORAL | Status: AC

## 2021-03-29 MED ORDER — CELECOXIB 200 MG PO CAPS: 200.0000 mg | ORAL_CAPSULE | ORAL | Status: AC

## 2021-03-29 MED ORDER — ACETAMINOPHEN 500 MG PO TABS: 1000.0000 mg | ORAL_TABLET | ORAL | Status: AC

## 2021-03-29 MED ORDER — ALVIMOPAN 12 MG PO CAPS: 12.0000 mg | ORAL_CAPSULE | ORAL | Status: AC

## 2021-03-29 MED ORDER — SODIUM CHLORIDE 0.9 % IV SOLN
2.0000 g | INTRAVENOUS | Status: AC
  Administered 2021-03-30: 2 g via INTRAVENOUS

## 2021-03-30 ENCOUNTER — Inpatient Hospital Stay
Admission: RE | Admit: 2021-03-30 | Discharge: 2021-04-01 | DRG: 330 | Disposition: A | Discharge status: Home / Self Care | Payer: BC Managed Care – PPO | Attending: Surgery | Admitting: Surgery

## 2021-03-30 ENCOUNTER — Inpatient Hospital Stay: Payer: BC Managed Care – PPO | Admitting: Certified Registered Nurse Anesthetist

## 2021-03-30 ENCOUNTER — Other Ambulatory Visit: Payer: Self-pay

## 2021-03-30 ENCOUNTER — Encounter
Admission: RE | Disposition: A | Discharge status: Home / Self Care | Payer: Self-pay | Source: Home / Self Care | Attending: Surgery

## 2021-03-30 ENCOUNTER — Encounter: Payer: Self-pay | Admitting: Surgery

## 2021-03-30 ENCOUNTER — Encounter: Payer: Self-pay | Admitting: General Surgery

## 2021-03-30 DIAGNOSIS — N321 Vesicointestinal fistula: Secondary | ICD-10-CM | POA: Diagnosis present

## 2021-03-30 DIAGNOSIS — K660 Peritoneal adhesions (postprocedural) (postinfection): Secondary | ICD-10-CM | POA: Diagnosis present

## 2021-03-30 DIAGNOSIS — R161 Splenomegaly, not elsewhere classified: Secondary | ICD-10-CM | POA: Diagnosis present

## 2021-03-30 DIAGNOSIS — K5732 Diverticulitis of large intestine without perforation or abscess without bleeding: Secondary | ICD-10-CM | POA: Diagnosis not present

## 2021-03-30 DIAGNOSIS — K632 Fistula of intestine: Secondary | ICD-10-CM | POA: Diagnosis present

## 2021-03-30 DIAGNOSIS — Z87440 Personal history of urinary (tract) infections: Secondary | ICD-10-CM | POA: Diagnosis not present

## 2021-03-30 DIAGNOSIS — Z96651 Presence of right artificial knee joint: Secondary | ICD-10-CM | POA: Diagnosis present

## 2021-03-30 DIAGNOSIS — Z8616 Personal history of COVID-19: Secondary | ICD-10-CM | POA: Diagnosis not present

## 2021-03-30 DIAGNOSIS — Z8249 Family history of ischemic heart disease and other diseases of the circulatory system: Secondary | ICD-10-CM | POA: Diagnosis not present

## 2021-03-30 DIAGNOSIS — K5720 Diverticulitis of large intestine with perforation and abscess without bleeding: Secondary | ICD-10-CM | POA: Diagnosis present

## 2021-03-30 DIAGNOSIS — Z87442 Personal history of urinary calculi: Secondary | ICD-10-CM

## 2021-03-30 DIAGNOSIS — Z9049 Acquired absence of other specified parts of digestive tract: Secondary | ICD-10-CM

## 2021-03-30 DIAGNOSIS — Z833 Family history of diabetes mellitus: Secondary | ICD-10-CM

## 2021-03-30 DIAGNOSIS — Z87891 Personal history of nicotine dependence: Secondary | ICD-10-CM

## 2021-03-30 DIAGNOSIS — K5792 Diverticulitis of intestine, part unspecified, without perforation or abscess without bleeding: Secondary | ICD-10-CM | POA: Diagnosis not present

## 2021-03-30 HISTORY — PX: CYSTOSCOPY: SHX5120

## 2021-03-30 LAB — CBC
HCT: 43.9 % (ref 39.0–52.0)
Hemoglobin: 15.7 g/dL (ref 13.0–17.0)
MCH: 32.3 pg (ref 26.0–34.0)
MCHC: 35.8 g/dL (ref 30.0–36.0)
MCV: 90.3 fL (ref 80.0–100.0)
Platelets: 204 10*3/uL (ref 150–400)
RBC: 4.86 MIL/uL (ref 4.22–5.81)
RDW: 13.3 % (ref 11.5–15.5)
WBC: 13 10*3/uL — ABNORMAL HIGH (ref 4.0–10.5)
nRBC: 0 % (ref 0.0–0.2)

## 2021-03-30 LAB — ABO/RH: ABO/RH(D): O NEG

## 2021-03-30 LAB — CREATININE, SERUM
Creatinine, Ser: 1.26 mg/dL — ABNORMAL HIGH (ref 0.61–1.24)
GFR, Estimated: >60 mL/min (ref >60)

## 2021-03-30 SURGERY — COLECTOMY, SIGMOID, ROBOT-ASSISTED
Anesthesia: General

## 2021-03-30 MED ORDER — DEXAMETHASONE SODIUM PHOSPHATE 10 MG/ML IJ SOLN
INTRAMUSCULAR | Status: AC
  Filled 2021-03-30: qty 1

## 2021-03-30 MED ORDER — OXYCODONE HCL 5 MG PO TABS
5.0000 mg | ORAL_TABLET | ORAL | Status: DC | PRN
  Administered 2021-03-30 – 2021-04-01 (×4): 10 mg via ORAL
  Administered 2021-04-01 (×2): 5 mg via ORAL
  Administered 2021-04-01: 10 mg via ORAL
  Filled 2021-03-30: qty 1
  Filled 2021-03-30 (×6): qty 2

## 2021-03-30 MED ORDER — KETAMINE HCL 50 MG/5ML IJ SOSY
PREFILLED_SYRINGE | INTRAMUSCULAR | Status: AC
  Filled 2021-03-30: qty 5

## 2021-03-30 MED ORDER — LIDOCAINE HCL (CARDIAC) PF 100 MG/5ML IV SOSY
PREFILLED_SYRINGE | INTRAVENOUS | Status: DC | PRN
  Administered 2021-03-30: 50 mg via INTRAVENOUS
  Administered 2021-03-30: 100 mg via INTRAVENOUS

## 2021-03-30 MED ORDER — PROPOFOL 10 MG/ML IV BOLUS
INTRAVENOUS | Status: DC | PRN
  Administered 2021-03-30: 200 mg via INTRAVENOUS

## 2021-03-30 MED ORDER — CHLORHEXIDINE GLUCONATE CLOTH 2 % EX PADS
6.0000 | MEDICATED_PAD | Freq: Every day | CUTANEOUS | Status: DC
  Administered 2021-03-31: 6 via TOPICAL

## 2021-03-30 MED ORDER — ORAL CARE MOUTH RINSE: 15.0000 mL | Freq: Once | OROMUCOSAL | Status: AC

## 2021-03-30 MED ORDER — ACETAMINOPHEN 500 MG PO TABS
1000.0000 mg | ORAL_TABLET | Freq: Four times a day (QID) | ORAL | Status: DC
  Administered 2021-03-30 – 2021-04-01 (×7): 1000 mg via ORAL
  Filled 2021-03-30 (×7): qty 2

## 2021-03-30 MED ORDER — PROCHLORPERAZINE EDISYLATE 10 MG/2ML IJ SOLN: 5.0000 mg | Freq: Four times a day (QID) | INTRAMUSCULAR | Status: DC | PRN

## 2021-03-30 MED ORDER — ACETAMINOPHEN 500 MG PO TABS
ORAL_TABLET | ORAL | Status: AC
  Administered 2021-03-30: 1000 mg via ORAL
  Filled 2021-03-30: qty 2

## 2021-03-30 MED ORDER — ENOXAPARIN SODIUM 40 MG/0.4ML IJ SOSY
40.0000 mg | PREFILLED_SYRINGE | INTRAMUSCULAR | Status: DC
  Administered 2021-03-31 – 2021-04-01 (×2): 40 mg via SUBCUTANEOUS
  Filled 2021-03-30 (×2): qty 0.4

## 2021-03-30 MED ORDER — CHLORHEXIDINE GLUCONATE 0.12 % MT SOLN
OROMUCOSAL | Status: AC
  Administered 2021-03-30: 15 mL via OROMUCOSAL
  Filled 2021-03-30: qty 15

## 2021-03-30 MED ORDER — MIDAZOLAM HCL 2 MG/2ML IJ SOLN
INTRAMUSCULAR | Status: AC
  Filled 2021-03-30: qty 2

## 2021-03-30 MED ORDER — CHLORHEXIDINE GLUCONATE 0.12 % MT SOLN: 15.0000 mL | Freq: Once | OROMUCOSAL | Status: AC

## 2021-03-30 MED ORDER — FAMOTIDINE 20 MG PO TABS
ORAL_TABLET | ORAL | Status: AC
  Administered 2021-03-30: 20 mg via ORAL
  Filled 2021-03-30: qty 1

## 2021-03-30 MED ORDER — ONDANSETRON HCL 4 MG/2ML IJ SOLN
INTRAMUSCULAR | Status: DC | PRN
  Administered 2021-03-30: 4 mg via INTRAVENOUS

## 2021-03-30 MED ORDER — ONDANSETRON HCL 4 MG/2ML IJ SOLN
INTRAMUSCULAR | Status: AC
  Filled 2021-03-30: qty 2

## 2021-03-30 MED ORDER — INDOCYANINE GREEN 25 MG IV SOLR
INTRAVENOUS | Status: DC | PRN
  Administered 2021-03-30: 25 mg

## 2021-03-30 MED ORDER — ONDANSETRON HCL 4 MG/2ML IJ SOLN: 4.0000 mg | Freq: Four times a day (QID) | INTRAMUSCULAR | Status: DC | PRN

## 2021-03-30 MED ORDER — SODIUM CHLORIDE (PF) 0.9 % IJ SOLN
INTRAMUSCULAR | Status: DC | PRN
  Administered 2021-03-30: 100 mL

## 2021-03-30 MED ORDER — HYDROMORPHONE HCL 1 MG/ML IJ SOLN
INTRAMUSCULAR | Status: AC
  Filled 2021-03-30: qty 1

## 2021-03-30 MED ORDER — DEXMEDETOMIDINE (PRECEDEX) IN NS 20 MCG/5ML (4 MCG/ML) IV SYRINGE
PREFILLED_SYRINGE | INTRAVENOUS | Status: DC | PRN
  Administered 2021-03-30: 8 ug via INTRAVENOUS

## 2021-03-30 MED ORDER — SODIUM CHLORIDE (PF) 0.9 % IJ SOLN
INTRAMUSCULAR | Status: AC
  Filled 2021-03-30: qty 100

## 2021-03-30 MED ORDER — SODIUM CHLORIDE 0.9 % IV SOLN
2.0000 g | Freq: Two times a day (BID) | INTRAVENOUS | Status: AC
  Administered 2021-03-30 – 2021-03-31 (×2): 2 g via INTRAVENOUS
  Filled 2021-03-30 (×2): qty 2

## 2021-03-30 MED ORDER — CELECOXIB 200 MG PO CAPS
ORAL_CAPSULE | ORAL | Status: AC
  Administered 2021-03-30: 200 mg via ORAL
  Filled 2021-03-30: qty 1

## 2021-03-30 MED ORDER — ALVIMOPAN 12 MG PO CAPS
ORAL_CAPSULE | ORAL | Status: AC
  Administered 2021-03-30: 12 mg via ORAL
  Filled 2021-03-30: qty 1

## 2021-03-30 MED ORDER — KETAMINE HCL 10 MG/ML IJ SOLN
INTRAMUSCULAR | Status: DC | PRN
  Administered 2021-03-30: 20 mg via INTRAVENOUS
  Administered 2021-03-30: 10 mg via INTRAVENOUS
  Administered 2021-03-30: 20 mg via INTRAVENOUS

## 2021-03-30 MED ORDER — HYDROMORPHONE HCL 1 MG/ML IJ SOLN
INTRAMUSCULAR | Status: DC | PRN
  Administered 2021-03-30 (×2): .5 mg via INTRAVENOUS

## 2021-03-30 MED ORDER — PROMETHAZINE HCL 25 MG/ML IJ SOLN: 6.2500 mg | INTRAMUSCULAR | Status: DC | PRN

## 2021-03-30 MED ORDER — EPHEDRINE 5 MG/ML INJ
INTRAVENOUS | Status: AC
  Filled 2021-03-30: qty 5

## 2021-03-30 MED ORDER — ONDANSETRON 4 MG PO TBDP: 4.0000 mg | ORAL_TABLET | Freq: Four times a day (QID) | ORAL | Status: DC | PRN

## 2021-03-30 MED ORDER — PHENYLEPHRINE HCL (PRESSORS) 10 MG/ML IV SOLN
INTRAVENOUS | Status: DC | PRN
  Administered 2021-03-30 (×3): 100 ug via INTRAVENOUS

## 2021-03-30 MED ORDER — FENTANYL CITRATE (PF) 100 MCG/2ML IJ SOLN: 25.0000 ug | INTRAMUSCULAR | Status: DC | PRN

## 2021-03-30 MED ORDER — MORPHINE SULFATE (PF) 4 MG/ML IV SOLN
4.0000 mg | INTRAVENOUS | Status: DC | PRN
  Administered 2021-03-31 (×3): 4 mg via INTRAVENOUS
  Filled 2021-03-30 (×3): qty 1

## 2021-03-30 MED ORDER — MIDAZOLAM HCL 2 MG/2ML IJ SOLN
INTRAMUSCULAR | Status: DC | PRN
  Administered 2021-03-30: 2 mg via INTRAVENOUS

## 2021-03-30 MED ORDER — SUGAMMADEX SODIUM 200 MG/2ML IV SOLN
INTRAVENOUS | Status: DC | PRN
  Administered 2021-03-30: 200 mg via INTRAVENOUS

## 2021-03-30 MED ORDER — METHOCARBAMOL 500 MG PO TABS: 500.0000 mg | ORAL_TABLET | Freq: Four times a day (QID) | ORAL | Status: DC | PRN

## 2021-03-30 MED ORDER — KETOROLAC TROMETHAMINE 30 MG/ML IJ SOLN
30.0000 mg | Freq: Four times a day (QID) | INTRAMUSCULAR | Status: DC
  Administered 2021-03-30 – 2021-04-01 (×8): 30 mg via INTRAVENOUS
  Filled 2021-03-30 (×8): qty 1

## 2021-03-30 MED ORDER — PROCHLORPERAZINE MALEATE 10 MG PO TABS
10.0000 mg | ORAL_TABLET | Freq: Four times a day (QID) | ORAL | Status: DC | PRN
  Filled 2021-03-30: qty 1

## 2021-03-30 MED ORDER — DIPHENHYDRAMINE HCL 25 MG PO CAPS: 25.0000 mg | ORAL_CAPSULE | Freq: Four times a day (QID) | ORAL | Status: DC | PRN

## 2021-03-30 MED ORDER — FENTANYL CITRATE (PF) 100 MCG/2ML IJ SOLN
INTRAMUSCULAR | Status: AC
  Filled 2021-03-30: qty 2

## 2021-03-30 MED ORDER — DIPHENHYDRAMINE HCL 50 MG/ML IJ SOLN: 25.0000 mg | Freq: Four times a day (QID) | INTRAMUSCULAR | Status: DC | PRN

## 2021-03-30 MED ORDER — 0.9 % SODIUM CHLORIDE (POUR BTL) OPTIME
TOPICAL | Status: DC | PRN
  Administered 2021-03-30: 100 mL
  Administered 2021-03-30: 200 mL

## 2021-03-30 MED ORDER — LACTATED RINGERS IV SOLN: INTRAVENOUS | Status: DC

## 2021-03-30 MED ORDER — BUPIVACAINE-EPINEPHRINE (PF) 0.25% -1:200000 IJ SOLN
INTRAMUSCULAR | Status: AC
  Filled 2021-03-30: qty 30

## 2021-03-30 MED ORDER — ROCURONIUM BROMIDE 10 MG/ML (PF) SYRINGE
PREFILLED_SYRINGE | INTRAVENOUS | Status: AC
  Filled 2021-03-30: qty 10

## 2021-03-30 MED ORDER — ROCURONIUM BROMIDE 100 MG/10ML IV SOLN
INTRAVENOUS | Status: DC | PRN
  Administered 2021-03-30: 10 mg via INTRAVENOUS
  Administered 2021-03-30 (×2): 20 mg via INTRAVENOUS
  Administered 2021-03-30 (×3): 10 mg via INTRAVENOUS
  Administered 2021-03-30: 50 mg via INTRAVENOUS

## 2021-03-30 MED ORDER — POLYVINYL ALCOHOL 1.4 % OP SOLN
1.0000 [drp] | OPHTHALMIC | Status: DC | PRN
  Administered 2021-03-30: 1 [drp] via OPHTHALMIC
  Filled 2021-03-30 (×2): qty 15

## 2021-03-30 MED ORDER — FENTANYL CITRATE (PF) 100 MCG/2ML IJ SOLN
INTRAMUSCULAR | Status: DC | PRN
  Administered 2021-03-30: 100 ug via INTRAVENOUS

## 2021-03-30 MED ORDER — INDOCYANINE GREEN 25 MG IV SOLR
INTRAVENOUS | Status: DC | PRN
  Administered 2021-03-30: 5 mg via INTRAVENOUS

## 2021-03-30 MED ORDER — BUPIVACAINE LIPOSOME 1.3 % IJ SUSP
INTRAMUSCULAR | Status: AC
  Filled 2021-03-30: qty 20

## 2021-03-30 MED ORDER — SODIUM CHLORIDE 0.9 % IV SOLN: INTRAVENOUS | Status: DC

## 2021-03-30 MED ORDER — METHYLENE BLUE 0.5 % INJ SOLN
INTRAVENOUS | Status: AC
  Filled 2021-03-30: qty 10

## 2021-03-30 MED ORDER — SODIUM CHLORIDE (PF) 0.9 % IJ SOLN
INTRAVENOUS | Status: DC | PRN
  Administered 2021-03-30: 100 mL

## 2021-03-30 MED ORDER — SODIUM CHLORIDE 0.9 % IV SOLN
INTRAVENOUS | Status: AC
  Filled 2021-03-30: qty 2

## 2021-03-30 MED ORDER — DEXAMETHASONE SODIUM PHOSPHATE 10 MG/ML IJ SOLN
INTRAMUSCULAR | Status: DC | PRN
  Administered 2021-03-30: 10 mg via INTRAVENOUS

## 2021-03-30 MED ORDER — TOBRAMYCIN 0.3 % OP SOLN
1.0000 [drp] | OPHTHALMIC | Status: DC
  Administered 2021-03-30 – 2021-03-31 (×4): 1 [drp] via OPHTHALMIC
  Filled 2021-03-30: qty 5

## 2021-03-30 MED ORDER — PROPOFOL 10 MG/ML IV BOLUS
INTRAVENOUS | Status: AC
  Filled 2021-03-30: qty 20

## 2021-03-30 MED ORDER — PANTOPRAZOLE SODIUM 40 MG IV SOLR
40.0000 mg | Freq: Every day | INTRAVENOUS | Status: DC
  Administered 2021-03-30 – 2021-03-31 (×2): 40 mg via INTRAVENOUS
  Filled 2021-03-30 (×2): qty 40

## 2021-03-30 MED ORDER — SODIUM CHLORIDE (PF) 0.9 % IJ SOLN
INTRAMUSCULAR | Status: AC
  Filled 2021-03-30: qty 50

## 2021-03-30 MED ORDER — GABAPENTIN 300 MG PO CAPS
ORAL_CAPSULE | ORAL | Status: AC
  Administered 2021-03-30: 300 mg via ORAL
  Filled 2021-03-30: qty 1

## 2021-03-30 MED ORDER — LIDOCAINE HCL (PF) 2 % IJ SOLN
INTRAMUSCULAR | Status: AC
  Filled 2021-03-30: qty 5

## 2021-03-30 MED ORDER — EPHEDRINE SULFATE 50 MG/ML IJ SOLN
INTRAMUSCULAR | Status: DC | PRN
  Administered 2021-03-30: 5 mg via INTRAVENOUS

## 2021-03-30 MED ORDER — INDOCYANINE GREEN 25 MG IV SOLR
INTRAVENOUS | Status: DC | PRN
Start: 2021-03-30 — End: 2021-03-30
  Administered 2021-03-30: 25 mg

## 2021-03-30 SURGICAL SUPPLY — 99 items
"PENCIL ELECTRO HAND CTR " (MISCELLANEOUS) ×2 IMPLANT
ADH SKN CLS APL DERMABOND .7 (GAUZE/BANDAGES/DRESSINGS) ×4
APL PRP STRL LF DISP 70% ISPRP (MISCELLANEOUS) ×2
BAG DRAIN CYSTO-URO LG1000N (MISCELLANEOUS) ×3 IMPLANT
BAG LAPAROSCOPIC 12 15 PORT 16 (BASKET) IMPLANT
BAG RETRIEVAL 12/15 (BASKET) ×3
CANNULA REDUC XI 12-8 STAPL (CANNULA) ×3
CANNULA REDUCER 12-8 DVNC XI (CANNULA) ×2 IMPLANT
CATH FOLEY 2WAY  5CC 16FR (CATHETERS) ×3
CATH FOLEY 2WAY  5CC 20FR SIL (CATHETERS) ×3
CATH FOLEY 2WAY 5CC 16FR (CATHETERS) ×2
CATH FOLEY 2WAY 5CC 20FR SIL (CATHETERS) IMPLANT
CATH ROBINSON RED A/P 16FR (CATHETERS) ×3 IMPLANT
CATH URETL OPEN 5X70 (CATHETERS) ×3 IMPLANT
CATH URTH 16FR FL 2W BLN LF (CATHETERS) ×2 IMPLANT
CHLORAPREP W/TINT 26 (MISCELLANEOUS) ×3 IMPLANT
COVER TIP SHEARS 8 DVNC (MISCELLANEOUS) ×2 IMPLANT
COVER TIP SHEARS 8MM DA VINCI (MISCELLANEOUS) ×3
DECANTER SPIKE VIAL GLASS SM (MISCELLANEOUS) ×3 IMPLANT
DEFOGGER SCOPE WARMER CLEARIFY (MISCELLANEOUS) ×3 IMPLANT
DERMABOND ADVANCED (GAUZE/BANDAGES/DRESSINGS) ×2
DERMABOND ADVANCED .7 DNX12 (GAUZE/BANDAGES/DRESSINGS) ×2 IMPLANT
DRAPE ARM DVNC X/XI (DISPOSABLE) ×8 IMPLANT
DRAPE COLUMN DVNC XI (DISPOSABLE) ×2 IMPLANT
DRAPE DA VINCI XI ARM (DISPOSABLE) ×12
DRAPE DA VINCI XI COLUMN (DISPOSABLE) ×3
DRAPE LEGGINS SURG 28X43 STRL (DRAPES) ×3 IMPLANT
DRAPE UNDER BUTTOCK W/FLU (DRAPES) ×4 IMPLANT
ELECT BLADE 6.5 EXT (BLADE) ×3 IMPLANT
ELECT CAUTERY BLADE 6.4 (BLADE) ×3 IMPLANT
ELECT REM PT RETURN 9FT ADLT (ELECTROSURGICAL) ×3
ELECTRODE REM PT RTRN 9FT ADLT (ELECTROSURGICAL) ×2 IMPLANT
GAUZE 4X4 16PLY ~~LOC~~+RFID DBL (SPONGE) ×9 IMPLANT
GLOVE SURG ENC MOIS LTX SZ7 (GLOVE) ×9 IMPLANT
GLOVE SURG UNDER POLY LF SZ7.5 (GLOVE) ×3 IMPLANT
GOWN STRL REUS W/ TWL LRG LVL3 (GOWN DISPOSABLE) ×12 IMPLANT
GOWN STRL REUS W/TWL LRG LVL3 (GOWN DISPOSABLE) ×18
GRASPER LAPSCPC 5X45 DSP (INSTRUMENTS) ×3 IMPLANT
GUIDEWIRE STR DUAL SENSOR (WIRE) ×1 IMPLANT
HANDLE YANKAUER SUCT BULB TIP (MISCELLANEOUS) ×3 IMPLANT
IRRIGATION STRYKERFLOW (MISCELLANEOUS) ×2 IMPLANT
IRRIGATOR STRYKERFLOW (MISCELLANEOUS) ×3
IV NS 1000ML (IV SOLUTION) ×3
IV NS 1000ML BAXH (IV SOLUTION) ×2 IMPLANT
IV NS IRRIG 3000ML ARTHROMATIC (IV SOLUTION) ×3 IMPLANT
KIT IMAGING PINPOINTPAQ (MISCELLANEOUS) ×7 IMPLANT
KIT PINK PAD W/HEAD ARE REST (MISCELLANEOUS) ×3
KIT PINK PAD W/HEAD ARM REST (MISCELLANEOUS) ×2 IMPLANT
LABEL OR SOLS (LABEL) ×3 IMPLANT
MANIFOLD NEPTUNE II (INSTRUMENTS) ×6 IMPLANT
NEEDLE HYPO 22GX1.5 SAFETY (NEEDLE) ×3 IMPLANT
NS IRRIG 500ML POUR BTL (IV SOLUTION) ×3 IMPLANT
OBTURATOR OPTICAL STANDARD 8MM (TROCAR) ×3
OBTURATOR OPTICAL STND 8 DVNC (TROCAR) ×2
OBTURATOR OPTICALSTD 8 DVNC (TROCAR) ×2 IMPLANT
PACK COLON CLEAN CLOSURE (MISCELLANEOUS) ×3 IMPLANT
PACK CYSTO AR (MISCELLANEOUS) ×3 IMPLANT
PACK LAP CHOLECYSTECTOMY (MISCELLANEOUS) ×3 IMPLANT
PAD PREP 24X41 OB/GYN DISP (PERSONAL CARE ITEMS) ×3 IMPLANT
PENCIL ELECTRO HAND CTR (MISCELLANEOUS) ×3 IMPLANT
PORT ACCESS TROCAR AIRSEAL 5 (TROCAR) ×3 IMPLANT
RELOAD STAPLE 60 3.5 BLU DVNC (STAPLE) IMPLANT
RELOAD STAPLER 3.5X60 BLU DVNC (STAPLE) ×4 IMPLANT
SEAL CANN UNIV 5-8 DVNC XI (MISCELLANEOUS) ×6 IMPLANT
SEAL XI 5MM-8MM UNIVERSAL (MISCELLANEOUS) ×9
SEALER VESSEL DA VINCI XI (MISCELLANEOUS) ×3
SEALER VESSEL EXT DVNC XI (MISCELLANEOUS) ×2 IMPLANT
SET CYSTO W/LG BORE CLAMP LF (SET/KITS/TRAYS/PACK) ×3 IMPLANT
SET TRI-LUMEN FLTR TB AIRSEAL (TUBING) ×3 IMPLANT
SOL PREP PVP 2OZ (MISCELLANEOUS) ×3
SOLUTION ELECTROLUBE (MISCELLANEOUS) ×3 IMPLANT
SOLUTION PREP PVP 2OZ (MISCELLANEOUS) ×2 IMPLANT
SPONGE T-LAP 18X18 ~~LOC~~+RFID (SPONGE) ×9 IMPLANT
SPONGE T-LAP 4X18 ~~LOC~~+RFID (SPONGE) ×3 IMPLANT
STAPLER 60 DA VINCI SURE FORM (STAPLE) ×3
STAPLER 60 SUREFORM DVNC (STAPLE) IMPLANT
STAPLER CANNULA SEAL DVNC XI (STAPLE) ×2 IMPLANT
STAPLER CANNULA SEAL XI (STAPLE) ×3
STAPLER CIRCULAR MANUAL XL 25 (STAPLE) ×1 IMPLANT
STAPLER RELOAD 3.5X60 BLU DVNC (STAPLE) ×4
STAPLER RELOAD 3.5X60 BLUE (STAPLE) ×6
SURGILUBE 2OZ TUBE FLIPTOP (MISCELLANEOUS) ×7 IMPLANT
SUT MNCRL AB 4-0 PS2 18 (SUTURE) ×3 IMPLANT
SUT PDS AB 0 CT1 27 (SUTURE) ×6 IMPLANT
SUT SILK 2 0 (SUTURE) ×3
SUT SILK 2 0 SH (SUTURE) ×4 IMPLANT
SUT SILK 2 0SH CR/8 30 (SUTURE) ×3 IMPLANT
SUT SILK 2-0 30XBRD TIE 12 (SUTURE) ×2 IMPLANT
SUT VIC AB 2-0 SH 27 (SUTURE) ×3
SUT VIC AB 2-0 SH 27XBRD (SUTURE) ×2 IMPLANT
SUT VICRYL 0 AB UR-6 (SUTURE) ×3 IMPLANT
SYR 10ML LL (SYRINGE) ×6 IMPLANT
SYR 20ML LL LF (SYRINGE) ×6 IMPLANT
SYR TOOMEY 50ML (SYRINGE) ×1 IMPLANT
SYR TOOMEY IRRIG 70ML (MISCELLANEOUS) ×3
SYRINGE TOOMEY IRRIG 70ML (MISCELLANEOUS) ×2 IMPLANT
SYS TROCAR 1.5-3 SLV ABD GEL (ENDOMECHANICALS) ×3
SYSTEM TROCR 1.5-3 SLV ABD GEL (ENDOMECHANICALS) ×2 IMPLANT
TRAY FOLEY SLVR 16FR LF STAT (SET/KITS/TRAYS/PACK) ×3 IMPLANT

## 2021-03-30 NOTE — Interval H&P Note (Signed)
History and Physical Interval Note:  03/30/2021 12:55 PM  Jeremy Owens  has presented today for surgery, with the diagnosis of diverticulitis.  The various methods of treatment have been discussed with the patient and family. After consideration of risks, benefits and other options for treatment, the patient has consented to  Procedure(s): XI ROBOT ASSISTED SIGMOID COLECTOMY, with Laqueta Due, PA-C to assist (N/A) INDOCYANINE GREEN FLUORESCENCE IMAGING (ICG) (N/A) as a surgical intervention.  The patient's history has been reviewed, patient examined, no change in status, stable for surgery.  I have reviewed the patient's chart and labs.  Questions were answered to the patient's satisfaction.     Jeremy Owens F Lamoyne Hessel

## 2021-03-30 NOTE — Op Note (Signed)
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 patient with diverticulitis and colovesical fistula.  He previously underwent a negative bladder biopsy with Dr. Apolinar Junes that showed only inflammation.  He presents today for colectomy with general surgery and takedown of colovesical fistula, neurology was requested to place ureteral ICG to aid in intraoperative identification.  After reviewing the management options for treatment, they elected to proceed with the above surgical procedure(s). We have discussed the potential benefits and risks of the procedure, side effects of the proposed treatment, the likelihood of the patient achieving the goals of the procedure, and any potential problems that might occur during the procedure or recuperation. Informed consent has been obtained.  Description of procedure:  The patient was taken to the operating room and general anesthesia was induced. SCDs were placed for DVT prophylaxis. The patient was placed in the dorsal lithotomy position, prepped and draped in the usual sterile fashion, and preoperative antibiotics were administered. A preoperative time-out was performed.   A 21 French rigid cystoscope was used to intubate the urethra and a normal-appearing urethra was followed proximally in the bladder.  The prostate was small.  Thorough cystoscopy showed that the ureteral orifices were  orthotopic bilaterally.  There was erythema and some fibrinous debris at the dome at the fistula site and site of biopsy previously.  No suspicious lesions.  A sensor wire was used to cannulate the left ureteral orifice and the access catheter was advanced over the wire to the mid ureter.  A total of 10 mL of ICG were instilled as the access catheter was gently pulled back to the ureteral orifice.  An identical procedure was performed on the right side.  A 20 [place] two-way Foley passed easily into the bladder with return of light green fluid, and 10 mL were placed in the balloon.  The catheter was clamped at the request of general surgery to stay in the bladder mucosa, and will be unclamped in the next 20 to 30 minutes as they proceed with surgery.  Disposition: Stable to PACU  Plan: Case returned to general surgery, catheter duration pending intraoperative findings  Legrand Rams, MD

## 2021-03-30 NOTE — Op Note (Signed)
PROCEDURES: 1. Robotic assisted Laparoscopic Low anterior resection 2. Robotic Laparoscopic takedown of splenic flexure 3. 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 ICG to check perfusion of the pedicle graft anastomoses  Pre-operative Diagnosis: Recurrent diverticulitis With colovesical fistula  Post-operative Diagnosis: same  Surgeon: Merri Ray Keyron Pokorski   Assistants: Laqueta Due [facility] ( required for the EEA anastomosis and for exposure)  Anesthesia: General endotracheal anesthesia  ASA Class: 2   Surgeon: Sterling Big , MD FACS  Anesthesia: Gen. 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 The benefits, complications, treatment options, and expected outcomes were discussed with the patient. The risks of bleeding, infection, recurrence of symptoms, failure to resolve symptoms, anastomotic leak, bowel injury, any of which could require further surgery were reviewed with the patient.   The patient was taken to Operating Room, identified  and the procedure verified.  A Time Out was held and the above information confirmed.  Prior to the induction of general anesthesia, antibiotic prophylaxis was administered. VTE prophylaxis was in place. General endotracheal anesthesia was then administered and tolerated well. After the induction, the abdomen was prepped with Chloraprep and draped in the sterile fashion. The patient was positioned in lithotomy position. 3.5 cm incision was created Left  lower quadrant. The abdominal cavity was entered under direct visualization and the Mini GelPort device was placed and pneumoperitoneum was obtained, no hemodynamic changes were apparent. . Three 8 mm robotic ports were placed under direct visualization and an additional 5 mm assist port was placed. Patient was positioned in steep trendelenburg and left side up. Robot was brought to the field and docked in the standard fashion. WE maintained visualization of our instruments at all times and avoided any collision between arms. I scrubbed out and went to the console. There were dense adhesions from sigmoid to the abdominal wall that where lysed in the standard fashion with the scissors. There was evidence of chronic inflammation between the sigmoid and bladder, extensive and meticulous dissection performed with scissors and cautery. We also identified a piece of small bowel densely adhere to the sigmoid. After meticulous dissection we realized that this was actually a fistula. Using scissors the fistula was incised / there remained a small enteroromy as a result of the fistula. I repair the bowel with interrupted 2-0 silk sutures.  We identified the takeoff of the inferior mesenteric artery dissected the pedicle and divided using vessel sealer  in the standard fashion.   Using the sealer we were able to divide the mesorectum and and also divided the mesentery of the descending colon we mobilized the descending colon IN a medial to lateral fashion. We preserved the ureters at all times. Pelvic dissection was performed in a standard fashion, being in the areolar space anterior to the sacrum. THe mesorectum was divided with the vessel sealer. We dissected the rectum circumferentially and We divided at the mid rectum with standard 60 mm blue  load  The white line of pot was identified and divided and  We were also able to mobilize the splenic flexure using scisors in the standard fashion. The mesentery of the  descending colon was divided and we divided the mid descending colon with a blue load 68mm stapler. We used ICG green to make sure the distal descneding colon had good perfusion. We made sure we had adequate reach so the anastomosis could be performed tension free. The specimen was removed in a bag. The robot was undocked and  I scrubbed back in. Under direct visualization the descending colon stump was opened and measured the diameter of the bowel A 25 mm dilator was perfect size. A pursestring was used after inserting the anvil device. Laqueta Due [facility] was able to pass a 25 mm standard EEA stapler device through the anus and Under direct visualization we performed an end to end anastomosis with the EEA device. A leak test was performed inflating the colon with a To[place]y syringe and a rubber catheter. No evidence of leak was observed. There was also adequate hemostasis.  All the laparoscopic ports were removed and a second look showed no evidence of any bleeding or any other injuries.  Liposomal marcaine was infiltrated at all incision sites in a full thickness fashion.  We changed gloves and  close the  abdomen with two -0 PDS sutures in a running fashion to close the anterior and posterior fascia respectively. The skin incisions were closed with 4-0 Monocryl. Dermabond was used to coat all the skin incisions. Needle and laparotomy count were correct and there were no immediate complications.  Sterling Big, MD, FACS

## 2021-03-30 NOTE — Anesthesia Procedure Notes (Signed)
Procedure Name: Intubation Date/Time: 03/30/2021 1:41 PM Performed by: Ginger Carne, CRNA Pre-anesthesia Checklist: Patient identified, Emergency Drugs available, Suction available, Patient being monitored and Timeout performed Patient Re-evaluated:Patient Re-evaluated prior to induction Oxygen Delivery Method: Circle system utilized Preoxygenation: Pre-oxygenation with 100% oxygen Induction Type: IV induction Ventilation: Mask ventilation without difficulty Laryngoscope Size: McGraph and 4 Grade View: Grade II Tube type: Oral Tube size: 7.5 mm Number of attempts: 1 Airway Equipment and Method: Stylet, Video-laryngoscopy and Oral airway Placement Confirmation: ETT inserted through vocal cords under direct vision, positive ETCO2 and breath sounds checked- equal and bilateral Secured at: 22 cm Tube secured with: Tape Dental Injury: Teeth and Oropharynx as per pre-operative assessment

## 2021-03-30 NOTE — Transfer of Care (Signed)
Immediate Anesthesia Transfer of Care Note  Patient: Jeremy Owens  Procedure(s) Performed: XI ROBOT ASSISTED SIGMOID COLECTOMY, with Laqueta Due, PA-C to assist INDOCYANINE GREEN FLUORESCENCE IMAGING (ICG) INDOCYANINE GREEN FLUORESCENCE IMAGING (ICG)  Patient Location: PACU  Anesthesia Type:General  Level of Consciousness: drowsy  Airway & Oxygen Therapy: Patient Spontanous Breathing and Patient connected to face mask oxygen  Post-op Assessment: Report given to RN and Post -op Vital signs reviewed and stable  Post vital signs: Reviewed and stable  Last Vitals:  Vitals Value Taken Time  BP 122/83 03/30/21 1845  Temp    Pulse 88 03/30/21 1849  Resp 15 03/30/21 1849  SpO2 100 % 03/30/21 1849  Vitals shown include unvalidated device data.  Last Pain:  Vitals:   03/30/21 0926  TempSrc: Tympanic  PainSc: 2          Complications: No notable events documented.

## 2021-03-30 NOTE — H&P (Signed)
03/30/21 10:00 AM   Jeremy Owens Jul 15, 1975 [id number]  CC: Diverticulitis, colovesical fistula  HPI: 45 year old male with diverticulitis and colovesical fistula(negative bladder biopsy recently with Dr. Apolinar Junes) who presents for robotic sigmoid colectomy with Dr. Everlene Farrier.  Urology is requested for bilateral ureteral ICG for intraoperative identification, as well as availability for possible bladder repair.   PMH: Past Medical History:  Diagnosis Date   COVID-19 2022   Diverticulitis    History of kidney stones    History of nephrolithiasis     Surgical History: Past Surgical History:  Procedure Laterality Date   COLONOSCOPY WITH PROPOFOL N/A 03/02/2021   Procedure: COLONOSCOPY WITH BIOPSY;  Surgeon: Midge Minium, MD;  Location: [facility];  Service: Endoscopy;  Laterality: N/A;   CYSTOSCOPY WITH BIOPSY N/A 02/06/2021   Procedure: CYSTOSCOPY WITH BLADDER BIOPSY;  Surgeon: Vanna Scotland, MD;  Location: [facility];  Service: Urology;  Laterality: N/A;   HARDWARE REMOVAL Right 2011   KNEE ARTHROSCOPY Right 2017   clean out meniscus   KNEE ARTHROSCOPY Right 2015   cleaned out meniscus   KNEE ARTHROSCOPY Right    clean out meniscus   KNEE ARTHROSCOPY Right 12/2018   replaced broken hardware   KNEE ARTHROSCOPY Right 10/06/2020   partial medial meniscectomy   MEDIAL PARTIAL KNEE REPLACEMENT Right 2013   ORIF  2010   right knee; injury from work   OTHER SURGICAL HISTORY Right 1996   right elbow nerve decompression   POLYPECTOMY N/A 03/02/2021   Procedure: POLYPECTOMY;  Surgeon: Midge Minium, MD;  Location: [facility];  Service: Endoscopy;  Laterality: N/A;   ULNAR NERVE TRANSPOSITION Right 05/15/2017   Procedure: Right elbow ulnar nerve decompression and/or transposition and nerve wrapping;  Surgeon: Bradly Bienenstock, MD;  Location: [facility] OR;  Service: Orthopedics;  Laterality: Right;  90 mins   WISDOM TOOTH EXTRACTION        Family History: Family  History  Problem Relation Age of Onset   Heart disease Mother    Diabetes Father    Prostate cancer Neg Hx    Kidney cancer Neg Hx    Bladder Cancer Neg Hx     Social History:  reports that he has never smoked. He quit smokeless tobacco use about 2 months ago.  His smokeless tobacco use included chew. He reports that he does not currently use alcohol after a past usage of about 4.0 standard drinks per week. He reports that he does not use drugs.  Physical Exam: BP (!) 147/99   Pulse 96   Temp (!) 97.5 F (36.4 C) (Tympanic)   Resp 16   Ht 6' (1.829 m)   Wt 94.8 kg   SpO2 99%   BMI 28.35 kg/m    Constitutional:  Alert and oriented, No acute distress. Cardiovascular: Regular rate and rhythm Respiratory: Clear to auscultation bilaterally GI: Abdomen is soft, nontender, nondistended, no abdominal masses  Urine culture 9/3 no growth  Assessment & Plan:   45 year old male with diverticulitis and colovesical fistula, bladder biopsy recently with Dr. Apolinar Junes showed inflammation but no evidence of malignancy.  Here today for colectomy with Dr. Everlene Farrier urology involved for bilateral instillation of ICG to aid in intraoperative identification, as well as for possible bladder repair if needed.  We discussed the risks of bleeding, infection, ureteral injury, bladder injury, possible need for cystorrhaphy and prolonged catheter placement.  Legrand Rams, MD 03/30/2021  [facility] [address] [place], [place] [address] [phone]  [phone]   

## 2021-03-30 NOTE — Anesthesia Preprocedure Evaluation (Signed)
Anesthesia Evaluation  Patient identified by MRN, date of birth, ID band Patient awake    Reviewed: Allergy & Precautions, NPO status , Patient's Chart, lab work & pertinent test results  History of Anesthesia Complications Negative for: history of anesthetic complications  Airway Mallampati: III  TM Distance: <3 FB Neck ROM: full    Dental  (+) Chipped   Pulmonary neg pulmonary ROS, neg shortness of breath, neg recent URI,    Pulmonary exam normal        Cardiovascular Exercise Tolerance: Good (-) angina(-) Past MI and (-) DOE negative cardio ROS Normal cardiovascular exam(-) dysrhythmias (-) Valvular Problems/Murmurs     Neuro/Psych negative neurological ROS  negative psych ROS   GI/Hepatic negative GI ROS, Neg liver ROS, neg GERD  ,  Endo/Other  negative endocrine ROS  Renal/GU      Musculoskeletal  (+) Arthritis ,   Abdominal   Peds  Hematology negative hematology ROS (+)   Anesthesia Other Findings Past Medical History: No date: Diverticulitis No date: History of kidney stones No date: History of nephrolithiasis  Past Surgical History: 2011: HARDWARE REMOVAL; Right 2017: KNEE ARTHROSCOPY; Right     Comment:  clean out meniscus 2015: KNEE ARTHROSCOPY; Right     Comment:  cleaned out meniscus No date: KNEE ARTHROSCOPY; Right     Comment:  clean out meniscus 12/2018: KNEE ARTHROSCOPY; Right     Comment:  replaced broken hardware 10/06/2020: KNEE ARTHROSCOPY; Right     Comment:  partial medial meniscectomy 2013: MEDIAL PARTIAL KNEE REPLACEMENT; Right 2010: ORIF     Comment:  right knee; injury from work 1996: OTHER SURGICAL HISTORY; Right     Comment:  right elbow nerve decompression 05/15/2017: ULNAR NERVE TRANSPOSITION; Right     Comment:  Procedure: Right elbow ulnar nerve decompression and/or               transposition and nerve wrapping;  Surgeon: Bradly Bienenstock, MD;  Location:  [facility] OR;  Service: Orthopedics;                Laterality: Right;  90 mins     Reproductive/Obstetrics negative OB ROS                             Anesthesia Physical  Anesthesia Plan  ASA: 2  Anesthesia Plan: General   Post-op Pain Management:    Induction: Intravenous  PONV Risk Score and Plan: Dexamethasone, Ondansetron, Midazolam and Treatment may vary due to age or medical condition  Airway Management Planned: Oral ETT  Additional Equipment:   Intra-op Plan:   Post-operative Plan: Extubation in OR  Informed Consent: I have reviewed the patients History and Physical, chart, labs and discussed the procedure including the risks, benefits and alternatives for the proposed anesthesia with the patient or authorized representative who has indicated his/her understanding and acceptance.     Dental Advisory Given  Plan Discussed with: Anesthesiologist, CRNA and Surgeon  Anesthesia Plan Comments: (Patient consented for risks of anesthesia including but not limited to:  - adverse reactions to medications - damage to eyes, teeth, lips or other oral mucosa - nerve damage due to positioning  - sore throat or hoarseness - Damage to heart, brain, nerves, lungs, other parts of body or loss of life  Patient voiced understanding.)        Anesthesia Quick  Evaluation

## 2021-03-31 LAB — CBC
HCT: 39.9 % (ref 39.0–52.0)
Hemoglobin: 14.6 g/dL (ref 13.0–17.0)
MCH: 33 pg (ref 26.0–34.0)
MCHC: 36.6 g/dL — ABNORMAL HIGH (ref 30.0–36.0)
MCV: 90.1 fL (ref 80.0–100.0)
Platelets: 231 10*3/uL (ref 150–400)
RBC: 4.43 MIL/uL (ref 4.22–5.81)
RDW: 13.2 % (ref 11.5–15.5)
WBC: 14.2 10*3/uL — ABNORMAL HIGH (ref 4.0–10.5)
nRBC: 0 % (ref 0.0–0.2)

## 2021-03-31 LAB — BASIC METABOLIC PANEL
Anion gap: 8 (ref 5–15)
BUN: 14 mg/dL (ref 6–20)
CO2: 25 mmol/L (ref 22–32)
Calcium: 8.6 mg/dL — ABNORMAL LOW (ref 8.9–10.3)
Chloride: 104 mmol/L (ref 98–111)
Creatinine, Ser: 1.11 mg/dL (ref 0.61–1.24)
GFR, Estimated: >60 mL/min (ref >60)
Glucose, Bld: 142 mg/dL — ABNORMAL HIGH (ref 70–99)
Potassium: 4.2 mmol/L (ref 3.5–5.1)
Sodium: 137 mmol/L (ref 135–145)

## 2021-03-31 LAB — HIV ANTIBODY (ROUTINE TESTING W REFLEX): HIV Screen 4th Generation wRfx: NONREACTIVE

## 2021-03-31 NOTE — Progress Notes (Signed)
[facility] SURGICAL PROGRESS NOTE  Hospital Day(s): 1.   Post op day(s): 1 Day Post-Op.   Interval History:  Patient seen and examined No acute events or new complaints overnight.  Patient reports he had a rough night, but he is feeling much better this morning. Abdominal soreness No fever, chills, nausea, emesis  He does have a slight leukocytosis to 14.2K; no fevers Renal function remains normal; sCr - 1.11; UO - 975 ccs No electrolyte derangements  He is on CLD; tolerating He is passing flatus Anxious to get out of bed  Vital signs in last 24 hours: [min-max] current  Temp:  [96.9 F (36.1 C)-98.7 F (37.1 C)] 98.1 F (36.7 C) (09/30 0745) Pulse Rate:  [80-99] 98 (09/30 0745) Resp:  [10-18] 18 (09/30 0745) BP: (107-147)/(76-99) 107/76 (09/30 0745) SpO2:  [95 %-100 %] 96 % (09/30 0745) Weight:  [94.8 kg] 94.8 kg (09/29 0926)     Height: 6' (182.9 cm) Weight: 94.8 kg BMI (Calculated): 28.34   Intake/Output last 2 shifts:  09/29 0701 - 09/30 0700 In: 1999.6 [I.V.:1799.6; IV Piggyback:200] Out: 1025 [Urine:975; Blood:50]   Physical Exam:  Constitutional: alert, cooperative and no distress  Respiratory: breathing non-labored at rest  Cardiovascular: regular rate and sinus rhythm  Gastrointestinal: Soft, incisional soreness, non-distended, no rebound/guarding Genitourinary: Foley in place; making urine  Integumentary: Laparoscopic incisions are CDI with dermabond, no erythema or drainage   Labs:  CBC Latest Ref Rng & Units 03/31/2021 03/30/2021 03/04/2021  WBC 4.0 - 10.5 K/uL 14.2(H) 13.0(H) 9.5  Hemoglobin 13.0 - 17.0 g/dL 25.4 27.0 62.3  Hematocrit 39.0 - 52.0 % 39.9 43.9 44.5  Platelets 150 - 400 K/uL 231 204 164   CMP Latest Ref Rng & Units 03/31/2021 03/30/2021 03/04/2021  Glucose 70 - 99 mg/dL 762(G) - 84  BUN 6 - 20 mg/dL 14 - 10  Creatinine 3.15 - 1.24 mg/dL 1.76 1.60(V) 3.71  Sodium 135 - 145 mmol/L 137 - 138  Potassium 3.5 - 5.1 mmol/L 4.2 -  3.3(L)  Chloride 98 - 111 mmol/L 104 - 100  CO2 22 - 32 mmol/L 25 - 28  Calcium 8.9 - 10.3 mg/dL 0.6(Y) - 9.5  Total Protein 6.5 - 8.1 g/dL - - 7.7  Total Bilirubin 0.3 - 1.2 mg/dL - - 1.0  Alkaline Phos 38 - 126 U/L - - 56  AST 15 - 41 U/L - - 16  ALT 0 - 44 U/L - - 24    Imaging studies: No new pertinent imaging studies   Assessment/Plan:  45 y.o. male 1 Day Post-Op s/p robotic assisted laparoscopic LAR with EES anastomosis for recurrent diverticulitis with colovesical fistula.   - Advance to full liquids for lunch; ADAT  - Wean from IVF as diet advances - Discontinue foley catheter  - Complete perioperative Abx - Monitor abdominal examination; on-going bowel function - Pain control prn; antiemetics prn   - Monitor leukocytosis; likely reactive; improving   - OOB as tolerated    All of the above findings and recommendations were discussed with the patient, patient's family (wife at bedside), and the medical team, and all of patient's and family's questions were answered to their expressed satisfaction.  -- Lynden Oxford, PA-C [facility] 03/31/2021, 8:08 AM [phone] M-F: 7am - 4pm

## 2021-03-31 NOTE — Anesthesia Postprocedure Evaluation (Signed)
Anesthesia Post Note  Patient: Jeremy Owens  Procedure(s) Performed: XI ROBOT ASSISTED SIGMOID COLECTOMY, with Laqueta Due, PA-C to assist INDOCYANINE GREEN FLUORESCENCE IMAGING (ICG) INDOCYANINE GREEN FLUORESCENCE IMAGING (ICG)  Patient location during evaluation: Nursing Unit Anesthesia Type: General Level of consciousness: awake and alert Pain management: pain level controlled Vital Signs Assessment: post-procedure vital signs reviewed and stable Respiratory status: spontaneous breathing, respiratory function stable and nonlabored ventilation Cardiovascular status: blood pressure returned to baseline and stable Postop Assessment: no apparent nausea or vomiting Anesthetic complications: no Comments: Pt had c/o R eye discomfort upon awaking from GA in PACU yesterday. This am Pt reports eye discomfort is resolved, to notify RN staff if discomfort returns this stay   No notable events documented.   Last Vitals:  Vitals:   03/31/21 0442 03/31/21 0745  BP: 116/90 107/76  Pulse: 99 98  Resp:  18  Temp: 37.1 C 36.7 C  SpO2: 95% 96%    Last Pain:  Vitals:   03/31/21 0745  TempSrc: Oral  PainSc:                  Jules Schick

## 2021-04-01 LAB — CBC
HCT: 37.4 % — ABNORMAL LOW (ref 39.0–52.0)
Hemoglobin: 12.8 g/dL — ABNORMAL LOW (ref 13.0–17.0)
MCH: 31.1 pg (ref 26.0–34.0)
MCHC: 34.2 g/dL (ref 30.0–36.0)
MCV: 90.8 fL (ref 80.0–100.0)
Platelets: 171 10*3/uL (ref 150–400)
RBC: 4.12 MIL/uL — ABNORMAL LOW (ref 4.22–5.81)
RDW: 13.5 % (ref 11.5–15.5)
WBC: 10.2 10*3/uL (ref 4.0–10.5)
nRBC: 0 % (ref 0.0–0.2)

## 2021-04-01 LAB — BASIC METABOLIC PANEL
Anion gap: 8 (ref 5–15)
BUN: 14 mg/dL (ref 6–20)
CO2: 27 mmol/L (ref 22–32)
Calcium: 8.5 mg/dL — ABNORMAL LOW (ref 8.9–10.3)
Chloride: 102 mmol/L (ref 98–111)
Creatinine, Ser: 1.01 mg/dL (ref 0.61–1.24)
GFR, Estimated: >60 mL/min (ref >60)
Glucose, Bld: 94 mg/dL (ref 70–99)
Potassium: 3.3 mmol/L — ABNORMAL LOW (ref 3.5–5.1)
Sodium: 137 mmol/L (ref 135–145)

## 2021-04-01 MED ORDER — PREGABALIN 100 MG PO CAPS: 100.0000 mg | ORAL_CAPSULE | Freq: Three times a day (TID) | ORAL | 0 refills | Status: DC

## 2021-04-01 MED ORDER — OXYCODONE-ACETAMINOPHEN 7.5-325 MG PO TABS: 1.0000 | ORAL_TABLET | ORAL | 0 refills | Status: DC | PRN

## 2021-04-01 NOTE — Plan of Care (Signed)
VSS, discharge instruction reviewed, IV's to be removed, RX given and patient to be discharged to home with wife

## 2021-04-01 NOTE — Discharge Summary (Signed)
Patient ID: Jeremy HODKINSON MRN: [id number] DOB/AGE: 07-24-1975 45 y.o.  Admit date: 03/30/2021 Discharge date: 04/01/2021   Discharge Diagnoses:  Active Problems:   S/P laparoscopic colectomy   Procedures: RoBotic low anterior resection  Hospital Course:   admitted after uneventful robotic laparoscopic LAR.  Patient was for two nights he had an uneventful postoperative course.  He was ambulating his bowels were working and he was advanced to a soft diet That he tolerated well.. At The time of discharge the patient was ambulating,  pain was controlled.  His vital signs were stable and she was afebrile.   physical exam at discharge showed a pt  in no acute distress.  Awake and alert.  Abdomen: Soft incisions healing well without infection or peritonitis.  Extremities well-perfused and no edema.  Condition of the patient the time of discharge was stable     Disposition: Discharge disposition: 01-Home or Self Care       Discharge Instructions     Call MD for:  difficulty breathing, headache or visual disturbances   Complete by: As directed    Call MD for:  extreme fatigue   Complete by: As directed    Call MD for:  hives   Complete by: As directed    Call MD for:  persistant dizziness or light-headedness   Complete by: As directed    Call MD for:  persistant nausea and vomiting   Complete by: As directed    Call MD for:  redness, tenderness, or signs of infection (pain, swelling, redness, odor or green/yellow discharge around incision site)   Complete by: As directed    Call MD for:  severe uncontrolled pain   Complete by: As directed    Call MD for:  temperature >100.4   Complete by: As directed    Diet - low sodium heart healthy   Complete by: As directed    Discharge instructions   Complete by: As directed    May shower daily   Driving Restrictions   Complete by: As directed    Don't drive While on narcotics   Increase activity slowly   Complete by: As directed     Lifting restrictions   Complete by: As directed    20 lbs x 6 wks      Allergies as of 04/01/2021   No Known Allergies      Medication List     STOP taking these medications    bisacodyl 5 MG EC tablet Commonly known as: DULCOLAX   ciprofloxacin 500 MG tablet Commonly known as: Cipro   cyclobenzaprine 5 MG tablet Commonly known as: FLEXERIL   HYDROcodone-acetaminophen 5-325 MG tablet Commonly known as: NORCO/VICODIN   metroNIDAZOLE 500 MG tablet Commonly known as: FLAGYL   neomycin 500 MG tablet Commonly known as: MYCIFRADIN   polyethylene glycol powder 17 GM/SCOOP powder Commonly known as: MiraLax   tamsulosin 0.4 MG Caps capsule Commonly known as: FLOMAX       TAKE these medications    ondansetron 4 MG disintegrating tablet Commonly known as: Zofran ODT Take 1 tablet (4 mg total) by mouth every 4 (four) hours as needed for nausea or vomiting.   oxyCODONE-acetaminophen 7.5-325 MG tablet Commonly known as: Percocet Take 1 tablet by mouth every 4 (four) hours as needed for severe pain.   pregabalin 100 MG capsule Commonly known as: Lyrica Take 1 capsule (100 mg total) by mouth 3 (three) times daily.        Follow-up Information  Donovan Kail, PA-C Follow up.   Specialty: Physician Assistant Contact information: [address] [place] [address] [phone]                  Sterling Big, MD FACS

## 2021-04-03 ENCOUNTER — Telehealth: Payer: Self-pay | Admitting: *Deleted

## 2021-04-03 LAB — SURGICAL PATHOLOGY

## 2021-04-03 NOTE — Telephone Encounter (Signed)
Faxed FMLA to [phone]

## 2021-04-05 ENCOUNTER — Telehealth: Payer: Self-pay

## 2021-04-05 NOTE — Telephone Encounter (Signed)
Called the patient. Per Dr Everlene Farrier his pathology showed only diverticulitis and no cancer. Patient will follow up as scheduled.

## 2021-04-06 ENCOUNTER — Encounter: Payer: Self-pay | Admitting: Surgery

## 2021-04-06 MED ORDER — SODIUM CHLORIDE 0.9 % IR SOLN
Status: DC | PRN
  Administered 2021-03-30: 3000 mL

## 2021-04-13 ENCOUNTER — Encounter: Payer: Self-pay | Admitting: Physician Assistant

## 2021-04-13 ENCOUNTER — Ambulatory Visit (INDEPENDENT_AMBULATORY_CARE_PROVIDER_SITE_OTHER): Payer: BC Managed Care – PPO | Admitting: Physician Assistant

## 2021-04-13 ENCOUNTER — Other Ambulatory Visit: Payer: Self-pay

## 2021-04-13 VITALS — BP 111/83 | HR 98 | Temp 98.0°F | Ht 72.0 in | Wt 210.0 lb

## 2021-04-13 DIAGNOSIS — K5732 Diverticulitis of large intestine without perforation or abscess without bleeding: Secondary | ICD-10-CM

## 2021-04-13 DIAGNOSIS — Z09 Encounter for follow-up examination after completed treatment for conditions other than malignant neoplasm: Secondary | ICD-10-CM

## 2021-04-13 DIAGNOSIS — N321 Vesicointestinal fistula: Secondary | ICD-10-CM

## 2021-04-13 MED ORDER — IBUPROFEN 600 MG PO TABS: 600.0000 mg | ORAL_TABLET | Freq: Four times a day (QID) | ORAL | 0 refills | Status: DC | PRN

## 2021-04-13 MED ORDER — TRAMADOL HCL 50 MG PO TABS
50.0000 mg | ORAL_TABLET | Freq: Four times a day (QID) | ORAL | 0 refills | Status: DC | PRN
Start: 2021-04-13 — End: 2021-05-02

## 2021-04-13 NOTE — Progress Notes (Signed)
[facility] POST-OP OFFICE VISIT  04/13/2021  HPI: Jeremy Owens is a 45 y.o. male 14 days s/p robotic assisted LAR with EEA, splenic flexure takedown, repair of small bowel fistula for chronic diverticulitis with colovesical fistula with Dr Everlene Farrier.   He is doing remarkably well given the extent of his surgery. He is very thankful for how well everything went and his return to normal function.  He continues to have some abdominal soreness, worse when laying on his right side and at RLQ incision. He is taking tylenol and oxycodone QHS which helps, but he is asking for something less strong. He stopped lyrica. No fever, chills, nausea, emesis He is tolerating PO No issues with bowel function; no diarrhea  Vital signs: BP 111/83   Pulse 98   Temp 98 F (36.7 C) (Oral)   Ht 6' (1.829 m)   Wt 210 lb (95.3 kg)   SpO2 99%   BMI 28.48 kg/m    Physical Exam: Constitutional: Well appearing male, NAD Abdomen: Soft, incisional soreness, non-distended, no rebound/guarding Skin: Incisions are healing well, no erythema or drainage, some ecchymosis  Assessment/Plan: [name] 14 days s/p robotic assisted LAR with EEA, splenic flexure takedown, repair of small bowel fistula for chronic diverticulitis with colovesical fistula   - Continue pain control; will add ibuprofen and switch to tramadol, DC oxycodone  - Reviewed wound care  - Reviewed lifting restrictions; 6 weeks total; his FMLA is done  - Reviewed surgical pathology  - I will see him in about 2 weeks for recheck   -- Lynden Oxford, PA-C [facility] 04/13/2021, 10:54 AM [phone] M-F: 7am - 4pm

## 2021-04-13 NOTE — Patient Instructions (Addendum)
Please call with any questions or concerns. Please pick up your medication at the pharmacy. Please see your appointment listed below.

## 2021-05-02 ENCOUNTER — Encounter: Payer: Self-pay | Admitting: Physician Assistant

## 2021-05-02 ENCOUNTER — Other Ambulatory Visit: Payer: Self-pay

## 2021-05-02 ENCOUNTER — Ambulatory Visit (INDEPENDENT_AMBULATORY_CARE_PROVIDER_SITE_OTHER): Payer: BC Managed Care – PPO | Admitting: Physician Assistant

## 2021-05-02 VITALS — BP 128/89 | HR 83 | Temp 98.7°F | Ht 72.0 in | Wt 218.4 lb

## 2021-05-02 DIAGNOSIS — Z09 Encounter for follow-up examination after completed treatment for conditions other than malignant neoplasm: Secondary | ICD-10-CM

## 2021-05-02 DIAGNOSIS — K5732 Diverticulitis of large intestine without perforation or abscess without bleeding: Secondary | ICD-10-CM

## 2021-05-02 DIAGNOSIS — N321 Vesicointestinal fistula: Secondary | ICD-10-CM

## 2021-05-02 NOTE — Patient Instructions (Signed)
Please call with any questions or concerns.

## 2021-05-02 NOTE — Progress Notes (Signed)
[facility] POST-OP OFFICE VISIT  05/02/2021  HPI: Jeremy Owens is a 45 y.o. male 1 month s/p robotic assisted LAR with EEA, splenic flexure takedown, repair of small bowel fistula for chronic diverticulitis with colovesical fistula with Dr Everlene Farrier.   He continues to do very well since surgery Only issue today is a suture protruding from one of his laparoscopic sites Some urinary urgency but nothing that is affecting his everyday life No fever, chills, nausea, emesis, or bowel changes Tolerating PO without issues No other complaints  Vital signs: BP 128/89   Pulse 83   Temp 98.7 F (37.1 C) (Oral)   Ht 6' (1.829 m)   Wt 218 lb 6.4 oz (99.1 kg)   SpO2 99%   BMI 29.62 kg/m    Physical Exam: Constitutional: Well appearing male, NAD Abdomen: Soft, non-tender, non-distended, no rebound/guarding Skin: Laparoscopic incisions are all well healed, he did have a Monocryl protruding from his most superior incision but I removed this without issues  Assessment/Plan: [name] 1 month s/p robotic assisted LAR with EEA, splenic flexure takedown, repair of small bowel fistula for chronic diverticulitis with colovesical fistula    - Pain control prn  - Reviewed wound care  - Reviewed lifting restrictions: ~10 more days to reach 6 week mark  - At this point, he can follow up on as needed basis. We will be happy to see him should questions/concerns arise in the future   -- Jeremy Oxford, PA-C [facility] 05/02/2021, 2:29 PM [phone] M-F: 7am - 4pm

## 2021-05-19 ENCOUNTER — Telehealth: Payer: Self-pay

## 2021-05-19 ENCOUNTER — Other Ambulatory Visit: Payer: Self-pay

## 2021-05-19 ENCOUNTER — Encounter: Payer: Self-pay | Admitting: Physician Assistant

## 2021-05-19 ENCOUNTER — Ambulatory Visit (INDEPENDENT_AMBULATORY_CARE_PROVIDER_SITE_OTHER): Payer: BC Managed Care – PPO | Admitting: Physician Assistant

## 2021-05-19 ENCOUNTER — Ambulatory Visit: Payer: Self-pay

## 2021-05-19 VITALS — BP 144/96 | HR 77 | Temp 98.0°F | Ht 72.0 in | Wt 220.0 lb

## 2021-05-19 DIAGNOSIS — Z09 Encounter for follow-up examination after completed treatment for conditions other than malignant neoplasm: Secondary | ICD-10-CM

## 2021-05-19 DIAGNOSIS — N50811 Right testicular pain: Secondary | ICD-10-CM

## 2021-05-19 DIAGNOSIS — K5732 Diverticulitis of large intestine without perforation or abscess without bleeding: Secondary | ICD-10-CM

## 2021-05-19 DIAGNOSIS — N321 Vesicointestinal fistula: Secondary | ICD-10-CM

## 2021-05-19 NOTE — Progress Notes (Signed)
[facility] POST-OP OFFICE VISIT  05/19/2021  HPI: Jeremy Owens is a 45 y.o. male 1.5 months s/p robotic assisted LAR with EEA, splenic flexure takedown, repair of small bowel fistula for chronic diverticulitis with colovesical fistula with Dr Everlene Farrier.  Patient reports he has continued to do well at home, but he has been feeling some right testicular soreness pretty much sense the surgery. This is mostly a 1/10 but can get exacerbated with everyday activity. He has been using Motrin as needed. He denied any dysuria, fever, chills, nausea, emesis, or bowel changes. He does not report feeling any bulging. He just wants to be certain there is nothing else going on.   Vital signs: BP (!) 144/96   Pulse 77   Temp 98 F (36.7 C)   Ht 6' (1.829 m)   Wt 220 lb (99.8 kg)   SpO2 98%   BMI 29.84 kg/m    Physical Exam: Constitutional: Well appearing male, NAD Abdomen: Soft, non-tender, non-distended, no rebound/guarding. I do not appreciate any inguinal hernia defects.  GU: Chaperone present, both testicle are descended appropriately, he has minimal soreness with elevation of the right testicle, again there are no obvious hernias present Skin: His laparoscopic incisions have healed well  Assessment/Plan: [name] 1.5 months s/p robotic assisted LAR with EEA, splenic flexure takedown, repair of small bowel fistula for chronic diverticulitis with colovesical fistula   - Unclear the etiology of his right testicular soreness. Suspect this continues to likely be expected post-operative pain. As a precaution, I will start with a scrotal US to evaluate. Should this be negative, we can consider pelvic CT.    - Continue pain control prn  - He has completed his lifting restrictions  - I will call him with [place] results and we can proceed from there. He is in agreement.   -- Lynden Oxford, PA-C [facility] 05/19/2021, 11:22 AM [phone] M-F: 7am -  4pm

## 2021-05-19 NOTE — Patient Instructions (Addendum)
Jeremy Owens will call you with the results from the Ultrasound. Ill call you later today with the appointment information.

## 2021-05-19 NOTE — Telephone Encounter (Signed)
Ultrasound scheduled Jeremy Owens will call patient with results. Patient notified.

## 2021-05-24 ENCOUNTER — Ambulatory Visit
Admission: RE | Admit: 2021-05-24 | Discharge: 2021-05-24 | Disposition: A | Discharge status: Home / Self Care | Payer: BC Managed Care – PPO | Source: Ambulatory Visit | Attending: Surgery | Admitting: Surgery

## 2021-05-24 DIAGNOSIS — N433 Hydrocele, unspecified: Secondary | ICD-10-CM | POA: Diagnosis not present

## 2021-05-24 DIAGNOSIS — N5082 Scrotal pain: Secondary | ICD-10-CM | POA: Diagnosis not present

## 2021-05-24 DIAGNOSIS — N50811 Right testicular pain: Secondary | ICD-10-CM | POA: Diagnosis not present

## 2021-05-31 ENCOUNTER — Telehealth: Payer: Self-pay | Admitting: *Deleted

## 2021-05-31 ENCOUNTER — Telehealth: Payer: Self-pay

## 2021-05-31 DIAGNOSIS — N50811 Right testicular pain: Secondary | ICD-10-CM

## 2021-05-31 DIAGNOSIS — R1031 Right lower quadrant pain: Secondary | ICD-10-CM

## 2021-05-31 NOTE — Telephone Encounter (Signed)
Patient called and is aware of results and CT scan information

## 2021-05-31 NOTE — Telephone Encounter (Signed)
Left detailed message with appointment information listed below.     Patient notified of Scrotum results and states he is continuing to have the discomfort and would like to proceed with CT scan of the pelvis.   CT Pelvis w/contrast scheduled 06/16/21 @ 2:15. Outpatient Imaging. Please pick up prep. Nothing by mouth 4 hours prior to scan.  Please follow up with Dr.Pabon 06/19/2021 @ 9:45.

## 2021-06-16 ENCOUNTER — Other Ambulatory Visit: Payer: Self-pay

## 2021-06-16 ENCOUNTER — Ambulatory Visit
Admission: RE | Admit: 2021-06-16 | Discharge: 2021-06-16 | Disposition: A | Discharge status: Home / Self Care | Payer: BC Managed Care – PPO | Source: Ambulatory Visit | Attending: Physician Assistant | Admitting: Physician Assistant

## 2021-06-16 DIAGNOSIS — K5730 Diverticulosis of large intestine without perforation or abscess without bleeding: Secondary | ICD-10-CM | POA: Diagnosis not present

## 2021-06-16 DIAGNOSIS — R1031 Right lower quadrant pain: Secondary | ICD-10-CM

## 2021-06-16 DIAGNOSIS — Z9049 Acquired absence of other specified parts of digestive tract: Secondary | ICD-10-CM | POA: Diagnosis not present

## 2021-06-16 DIAGNOSIS — N50811 Right testicular pain: Secondary | ICD-10-CM

## 2021-06-16 DIAGNOSIS — Z9889 Other specified postprocedural states: Secondary | ICD-10-CM | POA: Diagnosis not present

## 2021-06-16 MED ORDER — IOHEXOL 300 MG/ML  SOLN
100.0000 mL | Freq: Once | INTRAMUSCULAR | Status: AC | PRN
  Administered 2021-06-16: 100 mL via INTRAVENOUS

## 2021-06-19 ENCOUNTER — Other Ambulatory Visit: Payer: Self-pay

## 2021-06-19 ENCOUNTER — Encounter: Payer: Self-pay | Admitting: Surgery

## 2021-06-19 ENCOUNTER — Ambulatory Visit (INDEPENDENT_AMBULATORY_CARE_PROVIDER_SITE_OTHER): Payer: BC Managed Care – PPO | Admitting: Surgery

## 2021-06-19 VITALS — BP 129/84 | HR 69 | Temp 98.0°F | Ht 72.0 in | Wt 229.8 lb

## 2021-06-19 DIAGNOSIS — Z09 Encounter for follow-up examination after completed treatment for conditions other than malignant neoplasm: Secondary | ICD-10-CM

## 2021-06-19 NOTE — Patient Instructions (Signed)
If you have any concerns or questions, please feel free to call our office. Follow up as needed.  

## 2021-06-20 NOTE — Progress Notes (Signed)
This 45 year old male following robotic sigmoid colectomy for fistula on 03/30/2021.  He had some right lower abdominal pain.  And we perform a CT scan showing no evidence of hernias or any complications related to his surgery.  Pain has resolved.  He is taking p.o.  No fevers no chills no issues with urine. HE is back to normal  PE NAD Abd" : Soft incisions healed.  No complications no hernias no infection  A/P DOing very well RTC prn

## 2021-10-03 NOTE — Progress Notes (Signed)
? ?10/04/2021 ?5:14 PM  ? ?SELIG STEUER ?[birth year] ?[id number] ? ?Referring provider: Talmage Nap, PA-C ?[place],  [place] [address] ? ?No chief complaint on file. ? ? ? ?HPI: ?Jeremy Owens is a 46 y.o. male  with a personal history of colovesical fistula, and diverticulitis who presents today for further evaluation of vasectomy.  ? ?He has 2 biological children.  ? ?He is now 25 and his wife is 78, they desire no further biological pregnancies.  His children are 18 and 10. ? ?PMH: ?Past Medical History:  ?Diagnosis Date  ? COVID-19 2022  ? Diverticulitis   ? History of kidney stones   ? History of nephrolithiasis   ? ? ?Surgical History: ?Past Surgical History:  ?Procedure Laterality Date  ? COLONOSCOPY WITH PROPOFOL N/A 03/02/2021  ? Procedure: COLONOSCOPY WITH BIOPSY;  Surgeon: Lucilla Lame, MD;  Location: [facility];  Service: Endoscopy;  Laterality: N/A;  ? CYSTOSCOPY  03/30/2021  ? Procedure: CYSTOSCOPY;  Surgeon: Billey Co, MD;  Location: [facility];  Service: Urology;;  ? CYSTOSCOPY WITH BIOPSY N/A 02/06/2021  ? Procedure: CYSTOSCOPY WITH BLADDER BIOPSY;  Surgeon: Hollice Espy, MD;  Location: [facility];  Service: Urology;  Laterality: N/A;  ? HARDWARE REMOVAL Right 2011  ? KNEE ARTHROSCOPY Right 2017  ? clean out meniscus  ? KNEE ARTHROSCOPY Right 2015  ? cleaned out meniscus  ? KNEE ARTHROSCOPY Right   ? clean out meniscus  ? KNEE ARTHROSCOPY Right 12/2018  ? replaced broken hardware  ? KNEE ARTHROSCOPY Right 10/06/2020  ? partial medial meniscectomy  ? MEDIAL PARTIAL KNEE REPLACEMENT Right 2013  ? ORIF  2010  ? right knee; injury from work  ? OTHER SURGICAL HISTORY Right 1996  ? right elbow nerve decompression  ? POLYPECTOMY N/A 03/02/2021  ? Procedure: POLYPECTOMY;  Surgeon: Lucilla Lame, MD;  Location: [facility];  Service: Endoscopy;  Laterality: N/A;  ? ULNAR NERVE TRANSPOSITION Right 05/15/2017  ? Procedure: Right elbow ulnar nerve decompression  and/or transposition and nerve wrapping;  Surgeon: Iran Planas, MD;  Location: [facility];  Service: Orthopedics;  Laterality: Right;  90 mins  ? WISDOM TOOTH EXTRACTION    ? ? ?Home Medications:  ?Allergies as of 10/04/2021   ?No Known Allergies ?  ? ?  ?Medication List  ?  ?as of October 03, 2021  5:14 PM ?  ?You have not been prescribed any medications. ?  ? ? ?Allergies: No Known Allergies ? ?Family History: ?Family History  ?Problem Relation Age of Onset  ? Heart disease Mother   ? Diabetes Father   ? Prostate cancer Neg Hx   ? Kidney cancer Neg Hx   ? Bladder Cancer Neg Hx   ? ? ?Social History:  reports that he has never smoked. He quit smokeless tobacco use about 9 months ago.  His smokeless tobacco use included chew. He reports that he does not currently use alcohol after a past usage of about 4.0 standard drinks per week. He reports that he does not use drugs. ? ? ?Physical Exam: ?There were no vitals taken for this visit.  ?Constitutional:  Alert and oriented, No acute distress. ?HEENT: [place] AT, moist mucus membranes.  Trachea midline, no masses. ?Cardiovascular: No clubbing, cyanosis, or edema. ?Respiratory: Normal respiratory effort, no increased work of breathing. ?GI: Abdomen is soft, nontender, nondistended, no abdominal masses ?GU: Normal phallus.  Bilateral descended testicles without masses.  Vasa easily palpable bilaterally. ?Skin: No  rashes, bruises or suspicious lesions. ?Neurologic: Grossly intact, no focal deficits, moving all 4 extremities. ?Psychiatric: Normal mood and affect. ? ? ?Assessment & Plan:   ? ?1. Vasectomy evaluation ?Today, we discussed what the vas deferens is, where it is located, and its function. We reviewed the procedure for vasectomy, it's risks, benefits, alternatives, and likelihood of achieving his goals. We discussed in detail the procedure, complications, and recovery as well as the need for clearance prior to unprotected intercourse. We discussed that vasectomy does not  protect against sexually transmitted diseases. We discussed that this procedure does not result in immediate sterility and that they would need to use other forms of birth control until he has been cleared with negative postvasectomy semen analyses. I explained that the procedure is considered to be permanent and that attempts at reversal have varying degrees of success. These options include vasectomy reversal, sperm retrieval, and in vitro fertilization; these can be very expensive. We discussed the chance of postvasectomy pain syndrome which occurs in less than 5% of patients. I explained to the patient that there is no treatment to resolve this chronic pain, and that if it developed I would not be able to help resolve the issue, but that surgery is generally not needed for correction. I explained there have even been reports of systemic like illness associated with this chronic pain, and that there was no good cure. I explained that vasectomy it is not a 100% reliable form of birth control, and the risk of pregnancy after vasectomy is approximately 1 in 2000 men who had a negative postvasectomy semen analysis or rare non-motile sperm. I explained that repeat vasectomy was necessary in less than 1% of vasectomy procedures when employing the type of technique that I use. I explained that he should refrain from ejaculation for approximately one week following vasectomy. I explained that there are other options for birth control which are permanent and non-permanent; we discussed these. I explained the rates of surgical complications, such as symptomatic hematoma or infection, are low (1-2%) and vary with the surgeon's experience and criteria used to diagnose the complication.  ? ?The patient had the opportunity to ask questions to his stated satisfaction. He voiced understanding of the above factors and stated that he has read all the information provided to him and the packets and informed consent. ? ?He is  interested in receiving of Valium 10 mg prior to the procedure for the purpose of anxiolysis.  A prescription was given today.  He will have a driver on the day of the procedure. ? ? ? ?I,Kailey Littlejohn,acting as a scribe for Hollice Espy, MD.,have documented all relevant documentation on the behalf of Hollice Espy, MD,as directed by  Hollice Espy, MD while in the presence of Hollice Espy, MD. ? ?I have reviewed the above documentation for accuracy and completeness, and I agree with the above.  ? ?Hollice Espy, MD ? ?[facility] ?[address], [address] ?[place], [place] [address] ?[phone] ?

## 2021-10-04 ENCOUNTER — Encounter: Payer: Self-pay | Admitting: Urology

## 2021-10-04 ENCOUNTER — Ambulatory Visit: Payer: BC Managed Care – PPO | Admitting: Urology

## 2021-10-04 VITALS — BP 164/84 | HR 103 | Ht 72.0 in | Wt 229.0 lb

## 2021-10-04 DIAGNOSIS — Z3009 Encounter for other general counseling and advice on contraception: Secondary | ICD-10-CM | POA: Diagnosis not present

## 2021-10-04 NOTE — Patient Instructions (Signed)

## 2021-10-05 MED ORDER — DIAZEPAM 10 MG PO TABS: ORAL_TABLET | ORAL | 0 refills | Status: DC

## 2021-10-31 NOTE — Progress Notes (Signed)
11/01/2021 ? ?CC:  ?Chief Complaint  ?Patient presents with  ? VAS  ? ? ?HPI: ?Jeremy Owens is a 46 y.o. male with a personal history of colovesical fistula, and diverticulitis who presents today for vasectomy.  ? ?He has 2 biological children.  ? ?Vitals:  ? 11/01/21 1120  ?BP: (!) 136/97  ?Pulse: 100  ? ?NED. A&Ox3.   ?No respiratory distress   ?Abd soft, NT, ND ?Normal external genitalia with patent urethral meatus ? ?A timeout and consent were confirmed. ? ?Bilateral Vasectomy Procedure ? ?Pre-Procedure: ?- Patient's scrotum was prepped and draped for vasectomy. ?- The vas was palpated through the scrotal skin on the left. ?- 1% Xylocaine was injected into the skin and surrounding tissue for placement  ?- In a similar manner, the vas on the right was identified, anesthetized, and stabilized. ? ?Procedure: ?- A #11 blade was used to make a small stab incision in the skin overlying the vas ?- The left vas was isolated and brought up through the incision exposing that structure. ?- Bleeding points were cauterized as they occurred. ?- The vas was free from the surrounding structures and brought to the view. ?- A segment was positioned for placement with a hemostat. ?- A second hemostat was placed and a small segment between the two hemostats and was removed for inspection. ?- Each end of the transected vas lumen was fulgurated/ obliterated using needlepoint electrocautery ?-A fascial interposition was performed on testicular end of the vas using #3-0 chromic suture ?-The same procedure was performed on the right.  It said, the proximal end of the vas retracted.  I oversewed the distal and several times as well as additional fascial interposition on this side.  I did then identify what was felt to be the proximal end of the vas which was cauterized. ?- A single suture of #3-0 chromic catgut was used to close each lateral scrotal skin incision ?- A dressing was applied. ? ?Post-Procedure: ?- Patient was instructed  in care of the operative area ?- A specimen is to be delivered in 12 weeks  ? -Another form of contraception is to be used until post vasectomy semen analysis ? ?Tawni Millers as a scribe for Vanna Scotland, MD.,have documented all relevant documentation on the behalf of Vanna Scotland, MD,as directed by  Vanna Scotland, MD while in the presence of Vanna Scotland, MD. ? ?I have reviewed the above documentation for accuracy and completeness, and I agree with the above.  ? ?Vanna Scotland, MD ? ?

## 2021-11-01 ENCOUNTER — Encounter: Payer: Self-pay | Admitting: Urology

## 2021-11-01 ENCOUNTER — Ambulatory Visit: Payer: BC Managed Care – PPO | Admitting: Urology

## 2021-11-01 VITALS — BP 136/97 | HR 100 | Ht 72.0 in | Wt 230.0 lb

## 2021-11-01 DIAGNOSIS — Z302 Encounter for sterilization: Secondary | ICD-10-CM | POA: Diagnosis not present

## 2021-11-01 DIAGNOSIS — Z3009 Encounter for other general counseling and advice on contraception: Secondary | ICD-10-CM

## 2021-11-01 NOTE — Patient Instructions (Signed)

## 2021-11-05 ENCOUNTER — Encounter: Payer: Self-pay | Admitting: Urology

## 2021-11-15 ENCOUNTER — Encounter: Payer: BC Managed Care – PPO | Admitting: Urology

## 2021-12-10 ENCOUNTER — Emergency Department
Admission: EM | Admit: 2021-12-10 | Discharge: 2021-12-10 | Disposition: A | Discharge status: Home / Self Care | Payer: BC Managed Care – PPO | Attending: Emergency Medicine | Admitting: Emergency Medicine

## 2021-12-10 ENCOUNTER — Encounter: Payer: Self-pay | Admitting: Emergency Medicine

## 2021-12-10 ENCOUNTER — Emergency Department: Payer: BC Managed Care – PPO

## 2021-12-10 ENCOUNTER — Other Ambulatory Visit: Payer: Self-pay

## 2021-12-10 DIAGNOSIS — R002 Palpitations: Secondary | ICD-10-CM | POA: Diagnosis not present

## 2021-12-10 DIAGNOSIS — I493 Ventricular premature depolarization: Secondary | ICD-10-CM | POA: Diagnosis not present

## 2021-12-10 LAB — CBC
HCT: 47.9 % (ref 39.0–52.0)
Hemoglobin: 16.3 g/dL (ref 13.0–17.0)
MCH: 31 pg (ref 26.0–34.0)
MCHC: 34 g/dL (ref 30.0–36.0)
MCV: 91.1 fL (ref 80.0–100.0)
Platelets: 219 10*3/uL (ref 150–400)
RBC: 5.26 MIL/uL (ref 4.22–5.81)
RDW: 13.4 % (ref 11.5–15.5)
WBC: 7.5 10*3/uL (ref 4.0–10.5)
nRBC: 0 % (ref 0.0–0.2)

## 2021-12-10 LAB — BASIC METABOLIC PANEL
Anion gap: 7 (ref 5–15)
BUN: 10 mg/dL (ref 6–20)
CO2: 26 mmol/L (ref 22–32)
Calcium: 9.2 mg/dL (ref 8.9–10.3)
Chloride: 103 mmol/L (ref 98–111)
Creatinine, Ser: 0.88 mg/dL (ref 0.61–1.24)
GFR, Estimated: >60 mL/min (ref >60)
Glucose, Bld: 146 mg/dL — ABNORMAL HIGH (ref 70–99)
Potassium: 3.5 mmol/L (ref 3.5–5.1)
Sodium: 136 mmol/L (ref 135–145)

## 2021-12-10 LAB — TSH: TSH: 1.612 u[IU]/mL (ref 0.350–4.500)

## 2021-12-10 LAB — T4, FREE: Free T4: 0.68 ng/dL (ref 0.61–1.12)

## 2021-12-10 LAB — URINALYSIS, ROUTINE W REFLEX MICROSCOPIC
Bilirubin Urine: NEGATIVE
Glucose, UA: NEGATIVE mg/dL
Hgb urine dipstick: NEGATIVE
Ketones, ur: NEGATIVE mg/dL
Leukocytes,Ua: NEGATIVE
Nitrite: NEGATIVE
Protein, ur: NEGATIVE mg/dL
Specific Gravity, Urine: 1.008 (ref 1.005–1.030)
pH: 7 (ref 5.0–8.0)

## 2021-12-10 LAB — HEPATIC FUNCTION PANEL
ALT: 63 U/L — ABNORMAL HIGH (ref 0–44)
AST: 40 U/L (ref 15–41)
Albumin: 4.1 g/dL (ref 3.5–5.0)
Alkaline Phosphatase: 56 U/L (ref 38–126)
Bilirubin, Direct: 0.1 mg/dL (ref 0.0–0.2)
Indirect Bilirubin: 0.5 mg/dL (ref 0.3–0.9)
Total Bilirubin: 0.6 mg/dL (ref 0.3–1.2)
Total Protein: 8 g/dL (ref 6.5–8.1)

## 2021-12-10 LAB — MAGNESIUM: Magnesium: 2.1 mg/dL (ref 1.7–2.4)

## 2021-12-10 LAB — D-DIMER, QUANTITATIVE: D-Dimer, Quant: <0.27 ug/mL-FEU (ref 0.00–0.50)

## 2021-12-10 LAB — TROPONIN I (HIGH SENSITIVITY)
Troponin I (High Sensitivity): 6 ng/L (ref <18)
Troponin I (High Sensitivity): 5 ng/L (ref <18)

## 2021-12-10 MED ORDER — METOPROLOL TARTRATE 25 MG PO TABS: 12.5000 mg | ORAL_TABLET | Freq: Two times a day (BID) | ORAL | 0 refills | Status: DC

## 2021-12-10 MED ORDER — SODIUM CHLORIDE 0.9 % IV BOLUS
1000.0000 mL | Freq: Once | INTRAVENOUS | Status: AC
  Administered 2021-12-10: 1000 mL via INTRAVENOUS

## 2021-12-10 MED ORDER — METOPROLOL TARTRATE 25 MG PO TABS
12.5000 mg | ORAL_TABLET | Freq: Once | ORAL | Status: AC
  Administered 2021-12-10: 12.5 mg via ORAL
  Filled 2021-12-10: qty 1

## 2021-12-10 NOTE — ED Provider Notes (Signed)
[facility] Provider Note    Event Date/Time   First MD Initiated Contact with Patient 12/10/21 1145     (approximate)   History   Palpitations   HPI  Jeremy Owens is a 46 y.o. male who comes in from home with palpitations in the center of the chest.  Patient reports waking up this morning around 3 AM with some hotness and feeling some palpitations and his heart and had to cough and feels like he is catching his breath.  He reports maybe having this happen previously but nothing as prolonged as this.  He was able to fall back asleep but then had another episode a few hours later.  He denies any overt chest pain or shortness of breath, leg swelling.  He reports daily alcohol drinking but reports only 3 beers daily.  Denies any history of alcohol withdrawal and last drink yesterday.  Denies any drug use or new medications.  He does report drinking coffee.   Physical Exam   Triage Vital Signs: ED Triage Vitals  Enc Vitals Group     BP 12/10/21 1137 (!) 160/104     Pulse Rate 12/10/21 1137 (!) 125     Resp 12/10/21 1137 20     Temp 12/10/21 1137 98 F (36.7 C)     Temp Source 12/10/21 1137 Oral     SpO2 12/10/21 1137 100 %     Weight 12/10/21 1133 235 lb (106.6 kg)     Height 12/10/21 1133 6' (1.829 m)     Head Circumference --      Peak Flow --      Pain Score 12/10/21 1133 0     Pain Loc --      Pain Edu? --      Excl. in GC? --     Most recent vital signs: Vitals:   12/10/21 1137  BP: (!) 160/104  Pulse: (!) 125  Resp: 20  Temp: 98 F (36.7 C)  SpO2: 100%     General: Awake, no distress.  CV:  Good peripheral perfusion.  Tachycardic Resp:  Normal effort.  Abd:  No distention.  Other:  No leg swelling, no calf tenderness   ED Results / Procedures / Treatments   Labs (all labs ordered are listed, but only abnormal results are displayed) Labs Reviewed  BASIC METABOLIC PANEL - Abnormal; Notable for the following components:       Result Value   Glucose, Bld 146 (*)    All other components within normal limits  HEPATIC FUNCTION PANEL - Abnormal; Notable for the following components:   ALT 63 (*)    All other components within normal limits  CBC  MAGNESIUM  TSH  T4, FREE  D-DIMER, QUANTITATIVE  URINALYSIS, ROUTINE W REFLEX MICROSCOPIC  TROPONIN I (HIGH SENSITIVITY)     EKG  My interpretation of EKG:  Sinus tachycardia with a rate of 110 with occasional PVCs with some T wave versions in V4 V5 V6, normal intervals  RADIOLOGY I have reviewed the xray personally and interpreted and there is no evidence of any pneumonia  PROCEDURES:  Critical Care performed: No  .1-3 Lead EKG Interpretation  Performed by: Concha Se, MD Authorized by: Concha Se, MD     Interpretation: normal     ECG rate:  90   ECG rate assessment: normal     Rhythm: sinus rhythm     Ectopy: PVCs     Conduction: normal  MEDICATIONS ORDERED IN ED: Medications  sodium chloride 0.9 % bolus 1,000 mL (1,000 mLs Intravenous New Bag/Given 12/10/21 1213)     IMPRESSION / MDM / ASSESSMENT AND PLAN / ED COURSE  I reviewed the triage vital signs and the nursing notes.   Patient's presentation is most consistent with acute presentation with potential threat to life or bodily function.   Differential includes arrhythmia, ACS, PE, thyroid issues, Electra abnormalities.  We will give some IV fluids keep on the cardiac monitor  D-dimer is negative BMP normal CBC no anemia Troponin negative Mag normal TSH and T4 normal ALT slightly elevated at 63  Repeat evaluation patient feeling better occasionally having PVCs but not as common.  We will give a dose of metoprolol given he is symptomatic from it.  We will have patient follow-up with cardiology for Holter monitor and start a low-dose of metoprolol.  This time no evidence of A-fib to be blood thinner.  Patient expressed understanding felt comfortable with this  plan.  Considered admission for patient given negative cardiac markers x2 and heart rates are better after fluids and metoprolol patient feels comfortable with discharge   The patient is on the cardiac monitor to evaluate for evidence of arrhythmia and/or significant heart rate changes.      FINAL CLINICAL IMPRESSION(S) / ED DIAGNOSES   Final diagnoses:  Premature ventricular contraction     Rx / DC Orders   ED Discharge Orders          Ordered    Ambulatory referral to Cardiology       Comments: If you have not heard from the Cardiology office within the next 72 hours please call [phone].   12/10/21 1301             Note:  This document was prepared using Dragon voice recognition software and may include unintentional dictation errors.   Concha Se, MD 12/10/21 [phone]

## 2021-12-10 NOTE — Discharge Instructions (Addendum)
Try to cut down your alcohol use the case this could be contributing.  Follow-up with the cardiologist.  I placed a referral but if you do not hear from them the number is attached.  Return to the ER if develop fevers, shortness of breath or any other concern

## 2021-12-10 NOTE — ED Triage Notes (Signed)
Pt via POV from home. Pt c/o palpitations to the center of his chest. Pt states he feels like intermittently, he feels like his throat close. Pt also endorses a cough. Denies pain. Denies SOB.  Pt is A&OX4 and NAD

## 2021-12-21 ENCOUNTER — Encounter: Payer: Self-pay | Admitting: Cardiology

## 2021-12-21 ENCOUNTER — Ambulatory Visit: Payer: BC Managed Care – PPO | Admitting: Cardiology

## 2021-12-21 DIAGNOSIS — I493 Ventricular premature depolarization: Secondary | ICD-10-CM | POA: Diagnosis not present

## 2021-12-21 NOTE — Progress Notes (Signed)
Primary Care Provider: [facility] Cardiologist: None Electrophysiologist: None  Clinic Note: Chief Complaint  Patient presents with   New Patient (Initial Visit)    Ref by Artis Delay, MD for PVC's. Medications reviewed by the patient verbally.    Hospitalization Follow-up    Seen for palpitations in ER   ===================================  ASSESSMENT/PLAN   Problem List Items Addressed This Visit       Cardiology Problems   PVC (premature ventricular contraction)    Please palpitation episodes are not happening more frequently.  He really has not been up for 6 ER visit.  When they happen they tend to happen a little clusters.  Probably the best way to evaluate this is with a home monitor technique.  I do not think an event monitor will be reasonable because it probably would not catch any events.  Plan: Recommend - KardiaMobile in attempt to capture these episodes Recommended to continue using the Lopressor 12.5 mg twice daily to keep symptoms of bradycardia. Discussed importance of adequate hydration especially pre and post exercise.        Relevant Orders   EKG 12-Lead (Completed)    ===================================  HPI:    Jeremy Owens is a 46 y.o. 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  Recent Hospitalizations:  [facility] ER 12/10/2021 -> presented with palpitations and chest.  Woke up around 3 AM feeling hot and flushed with some palpitations in his heart.  Had a cough.  Noted that he had a catch his breath.  He was able to fall back asleep again and had another episode a few hours later.  Denied any overt chest pain or dyspnea, leg swelling.  No PND orthopnea.  Drinks 3 beers daily.  No history of withdrawal.  Does also drink coffee  but had not had any at night. =>  Noted to be hypertensive at 106/104 with heart rate of 125 bpm.   EKG shows sinus tachycardia with rate of 110 with occasional PVCs. Troponin, D-dimer normal.  BMP normal.  TSH normal.  Given a dose of metoprolol. Referral to cardiology  Roland Earl was referred to cardiology by ER  Reviewed  CV studies:    The following studies were reviewed today: (if available, images/films reviewed: From Epic Chart or Care Everywhere) None:  Interval History:   Roland Earl  "Renae Fickle" is here for cardiology evaluation after his ER visit earlier this month.  He actually has not been taking his blood pressure for the last couple days. He describes a sensation he felt as a funny sensation in the center of his chest that associated with liver flushing and lightheadedness.  He denied any real sensation of chest pain or pressure other than that fluttering felt He has had a couple of these episodes off and on since then but not to the extent that he had when he went to the ER.  He denies any resting or exertional dyspnea.  No exertional chest pain or pressure.  Although he feels a fluttering sensations do not last very long and not associated with syncope or near syncope.  CV Review of Symptoms (Summary) Cardiovascular ROS: no chest pain or dyspnea on exertion positive for - irregular heartbeat, paroxysmal nocturnal dyspnea, and was associated with "discomfort" and  lightheadedness but no true pain. negative for - edema, orthopnea, paroxysmal nocturnal dyspnea, rapid heart rate, shortness of breath, or syncope/near syncope or TIA/amaurosis fugax, claudication  REVIEWED OF SYSTEMS   Review of Systems  Constitutional:  Negative for weight loss.  HENT:  Negative for nosebleeds.   Respiratory:  Positive for cough (He probably had a cough when he had the irregular heartbeats.). Negative for shortness of breath and wheezing.   Cardiovascular:        Per HPI   Gastrointestinal:  Negative for blood in stool and melena.  Genitourinary:  Negative for hematuria.  Musculoskeletal:  Negative for falls and joint pain.  Neurological:  Positive for dizziness (He felt a little dizzy and lightheaded with the irregular heartbeats.). Negative for focal weakness and weakness.  Psychiatric/Behavioral: Negative.      I have reviewed and (if needed) personally updated the patient's problem list, medications, allergies, past medical and surgical history, social and family history.   PAST MEDICAL HISTORY   Past Medical History:  Diagnosis Date   COVID-19 2022   Diverticulitis    History of kidney stones    History of nephrolithiasis     PAST SURGICAL HISTORY   Past Surgical History:  Procedure Laterality Date   COLONOSCOPY WITH PROPOFOL N/A 03/02/2021   Procedure: COLONOSCOPY WITH BIOPSY;  Surgeon: Midge Minium, MD;  Location: [facility];  Service: Endoscopy;  Laterality: N/A;   CYSTOSCOPY  03/30/2021   Procedure: CYSTOSCOPY;  Surgeon: Sondra Come, MD;  Location: [facility];  Service: Urology;;   CYSTOSCOPY WITH BIOPSY N/A 02/06/2021   Procedure: CYSTOSCOPY WITH BLADDER BIOPSY;  Surgeon: Vanna Scotland, MD;  Location: [facility];  Service: Urology;  Laterality: N/A;   HARDWARE REMOVAL Right 2011   KNEE ARTHROSCOPY Right 2017   clean out meniscus   KNEE ARTHROSCOPY Right 2015   cleaned out meniscus   KNEE ARTHROSCOPY Right    clean out meniscus   KNEE ARTHROSCOPY Right 12/2018   replaced broken hardware   KNEE ARTHROSCOPY Right 10/06/2020   partial medial meniscectomy   MEDIAL PARTIAL KNEE REPLACEMENT Right 2013   ORIF  2010   right knee; injury from work   OTHER SURGICAL HISTORY Right 1996   right elbow nerve decompression   POLYPECTOMY N/A 03/02/2021   Procedure: POLYPECTOMY;  Surgeon: Midge Minium, MD;  Location: [facility];  Service: Endoscopy;  Laterality: N/A;   ULNAR NERVE TRANSPOSITION Right 05/15/2017   Procedure:  Right elbow ulnar nerve decompression and/or transposition and nerve wrapping;  Surgeon: Bradly Bienenstock, MD;  Location: [facility] OR;  Service: Orthopedics;  Laterality: Right;  90 mins   WISDOM TOOTH EXTRACTION      Immunization History  Administered Date(s) Administered   Tdap 09/11/2015    MEDICATIONS/ALLERGIES   Current Meds  Medication Sig   metoprolol tartrate (LOPRESSOR) 25 MG tablet Take 0.5 tablets (12.5 mg total) by mouth 2 (two) times daily for 14 days.    No Known Allergies  SOCIAL HISTORY/FAMILY HISTORY   Reviewed in Epic:   Social History   Tobacco Use   Smoking status: Never   Smokeless tobacco: Former    Types: Chew    Quit date: 12/2020  Vaping Use   Vaping Use: Never used  Substance Use Topics   Alcohol use: Not Currently    Alcohol/week: 4.0 standard drinks of alcohol    Types: 4 Cans of beer per week   Drug use: No   Social History   Social  History Narrative   Not on file   Family History  Problem Relation Age of Onset   Heart disease Mother    Diabetes Father    Prostate cancer Neg Hx    Kidney cancer Neg Hx    Bladder Cancer Neg Hx     OBJCTIVE -PE, EKG, labs   Wt Readings from Last 3 Encounters:  12/21/21 235 lb (106.6 kg)  12/10/21 235 lb (106.6 kg)  11/01/21 230 lb (104.3 kg)    Physical Exam: BP 126/90 (BP Location: Right Arm, Patient Position: Sitting, Cuff Size: Normal)   Pulse 82   Ht 6' (1.829 m)   Wt 235 lb (106.6 kg)   SpO2 98%   BMI 31.87 kg/m  Physical Exam Vitals reviewed.  Constitutional:      General: He is not in acute distress.    Appearance: Normal appearance. He is obese. He is not toxic-appearing or diaphoretic.  HENT:     Head: Normocephalic and atraumatic.  Neck:     Vascular: No carotid bruit or JVD.  Cardiovascular:     Rate and Rhythm: Normal rate and regular rhythm. No extrasystoles are present.    Chest Wall: PMI is not displaced.     Pulses: Normal pulses and intact distal pulses.     Heart  sounds: S1 normal and S2 normal. Heart sounds are distant. No murmur heard.    No friction rub. No gallop.  Pulmonary:     Effort: Pulmonary effort is normal. No respiratory distress.     Breath sounds: No wheezing, rhonchi or rales.  Chest:     Chest wall: No tenderness.  Abdominal:     General: Abdomen is flat. Bowel sounds are normal. There is no distension.     Palpations: Abdomen is soft. There is no mass.     Tenderness: There is no abdominal tenderness. There is no guarding.     Comments: No HSM or bruit  Musculoskeletal:     Cervical back: Normal range of motion and neck supple.  Skin:    General: Skin is warm and dry.     Coloration: Skin is not jaundiced.  Neurological:     General: No focal deficit present.     Mental Status: He is alert and oriented to person, place, and time.     Cranial Nerves: No cranial nerve deficit.     Gait: Gait normal.  Psychiatric:        Mood and Affect: Mood normal.        Behavior: Behavior normal.        Thought Content: Thought content normal.        Judgment: Judgment normal.     Adult ECG Report  Rate: 82 ;  Rhythm: normal sinus rhythm and normal axis clinical Strader. ;   Narrative Interpretation: Normal  Recent Labs: Reviewed. Lab Results  Component Value Date   CHOL 151 02/02/2021   HDL 44 02/02/2021   LDLCALC 91 02/02/2021   TRIG 86 02/02/2021   CHOLHDL 3.4 02/02/2021   Lab Results  Component Value Date   CREATININE 0.88 12/10/2021   BUN 10 12/10/2021   NA 136 12/10/2021   K 3.5 12/10/2021   CL 103 12/10/2021   CO2 26 12/10/2021      Latest Ref Rng & Units 12/10/2021   11:38 AM 04/01/2021    5:24 AM 03/31/2021    4:27 AM  CBC  WBC 4.0 - 10.5 K/uL 7.5  10.2  14.2  Hemoglobin 13.0 - 17.0 g/dL 01.0  27.2  53.6   Hematocrit 39.0 - 52.0 % 47.9  37.4  39.9   Platelets 150 - 400 K/uL 219  171  231     Lab Results  Component Value Date   HGBA1C 5.1 02/02/2021   Lab Results  Component Value Date   TSH 1.612  12/10/2021    ==================================================  I spent a total of 17 minutes with the patient spent in direct patient consultation.  Additional time spent with chart review  / charting (studies, outside notes, etc): 15 min Total Time: 32 min  Current medicines are reviewed at length with the patient today.  (+/- concerns) none  Notice: This dictation was prepared with Dragon dictation along with smart phrase technology. Any transcriptional errors that result from this process are unintentional and may not be corrected upon review.   Studies Ordered:  Orders Placed This Encounter  Procedures   EKG 12-Lead   No orders of the defined types were placed in this encounter.   Patient Instructions / Medication Changes & Studies & Tests Ordered   Patient Instructions  Medication Instructions:  - Your physician recommends that you continue on your current medications as directed. Please refer to the Current Medication list given to you today.  Continue to use metoprolol Make sure you hydrate with water daily   *If you need a refill on your cardiac medications before your next appointment, please call your pharmacy*   Lab Work: - none ordered  If you have labs (blood work) drawn today and your tests are completely normal, you will receive your results only by: MyChart Message (if you have MyChart) OR A paper copy in the mail If you have any lab test that is abnormal or we need to change your treatment, we will call you to review the results.   Testing/Procedures: - none ordered   Follow-Up: At [facility], you and your health needs are our priority.  As part of our continuing mission to provide you with exceptional heart care, we have created designated Provider Care Teams.  These Care Teams include your primary Cardiologist (physician) and Advanced Practice Providers (APPs -  Physician Assistants and Nurse Practitioners) who all work together to provide you  with the care you need, when you need it.  We recommend signing up for the patient portal called "MyChart".  Sign up information is provided on this After Visit Summary.  MyChart is used to connect with patients for Virtual Visits (Telemedicine).  Patients are able to view lab/test results, encounter notes, upcoming appointments, etc.  Non-urgent messages can be sent to your provider as well.   To learn more about what you can do with MyChart, go to [email or web address].    Your next appointment:   8 month(s)  The format for your next appointment:   In Person  Provider:   Bryan Lemma, MD    Other Instructions  Kardia for monitoring of EKGs at home:  You can look into the [facility] device by [employer].This device is purchased by you and it connects to an application you download to your smart phone.  It can detect abnormal heart rhythms and alert you to contact your doctor for further evaluation. The device is approximately $90 and the phone application is free.  The web site is:  https://www.alivecor.com    Important Information About Sugar          Bryan Lemma, M.D., M.S. Interventional Cardiologist   Pager #  [phone] Phone # [phone] [phone] [place], [place] [address]   Thank you for choosing Heart[facility] in [place]!!

## 2021-12-21 NOTE — Patient Instructions (Addendum)
Medication Instructions:  - Your physician recommends that you continue on your current medications as directed. Please refer to the Current Medication list given to you today.  Continue to use metoprolol Make sure you hydrate with water daily   *If you need a refill on your cardiac medications before your next appointment, please call your pharmacy*   Lab Work: - none ordered  If you have labs (blood work) drawn today and your tests are completely normal, you will receive your results only by: MyChart Message (if you have MyChart) OR A paper copy in the mail If you have any lab test that is abnormal or we need to change your treatment, we will call you to review the results.   Testing/Procedures: - none ordered   Follow-Up: At [facility], you and your health needs are our priority.  As part of our continuing mission to provide you with exceptional heart care, we have created designated Provider Care Teams.  These Care Teams include your primary Cardiologist (physician) and Advanced Practice Providers (APPs -  Physician Assistants and Nurse Practitioners) who all work together to provide you with the care you need, when you need it.  We recommend signing up for the patient portal called "MyChart".  Sign up information is provided on this After Visit Summary.  MyChart is used to connect with patients for Virtual Visits (Telemedicine).  Patients are able to view lab/test results, encounter notes, upcoming appointments, etc.  Non-urgent messages can be sent to your provider as well.   To learn more about what you can do with MyChart, go to [email or web address].    Your next appointment:   8 month(s)  The format for your next appointment:   In Person  Provider:   Bryan Lemma, MD    Other Instructions  Kardia for monitoring of EKGs at home:  You can look into the [facility] device by [employer].This device is purchased by you and it connects to an application you  download to your smart phone.  It can detect abnormal heart rhythms and alert you to contact your doctor for further evaluation. The device is approximately $90 and the phone application is free.  The web site is:  https://www.alivecor.com    Important Information About Sugar

## 2022-01-10 ENCOUNTER — Encounter: Payer: Self-pay | Admitting: Medical

## 2022-01-20 ENCOUNTER — Encounter: Payer: Self-pay | Admitting: Cardiology

## 2022-01-20 NOTE — Assessment & Plan Note (Addendum)
Please palpitation episodes are not happening more frequently.  He really has not been up for 6 ER visit.  When they happen they tend to happen a little clusters.  Probably the best way to evaluate this is with a home monitor technique.  I do not think an event monitor will be reasonable because it probably would not catch any events.  Plan: Recommend - KardiaMobile in attempt to capture these episodes  Recommended to continue using the Lopressor 12.5 mg twice daily to keep symptoms of bradycardia.  Discussed importance of adequate hydration especially pre and post exercise.

## 2022-02-01 ENCOUNTER — Other Ambulatory Visit: Payer: BC Managed Care – PPO

## 2022-02-01 DIAGNOSIS — Z3009 Encounter for other general counseling and advice on contraception: Secondary | ICD-10-CM | POA: Diagnosis not present

## 2022-02-02 LAB — POST-VAS SPERM EVALUATION,QUAL: Volume: 1.2 mL

## 2022-02-07 ENCOUNTER — Telehealth: Payer: Self-pay | Admitting: *Deleted

## 2022-02-07 NOTE — Telephone Encounter (Addendum)
Left patient VM to return my call   ----- Message from Vanna Scotland, MD sent at 02/05/2022  8:41 AM EDT ----- There is still sperm present in the sample!!!!  Please continue to use alternative forms of birth control.  Ejaculate 10 more times and plan for repeat semen analysis in 1 month.  Vanna Scotland, MD

## 2022-02-23 ENCOUNTER — Other Ambulatory Visit: Payer: Self-pay

## 2022-02-23 DIAGNOSIS — Z9852 Vasectomy status: Secondary | ICD-10-CM

## 2022-02-26 ENCOUNTER — Other Ambulatory Visit: Payer: BC Managed Care – PPO

## 2022-03-13 ENCOUNTER — Other Ambulatory Visit: Payer: BC Managed Care – PPO

## 2022-03-13 DIAGNOSIS — Z9852 Vasectomy status: Secondary | ICD-10-CM | POA: Diagnosis not present

## 2022-03-14 LAB — POST-VAS SPERM EVALUATION,QUAL: Volume: 0.5 mL

## 2022-05-01 ENCOUNTER — Encounter: Payer: Self-pay | Admitting: Cardiology

## 2022-05-03 ENCOUNTER — Other Ambulatory Visit: Payer: Self-pay | Admitting: *Deleted

## 2022-05-03 MED ORDER — METOPROLOL TARTRATE 25 MG PO TABS: ORAL_TABLET | ORAL | 1 refills | Status: DC

## 2022-09-03 ENCOUNTER — Ambulatory Visit: Payer: BC Managed Care – PPO | Admitting: Nurse Practitioner

## 2022-09-03 ENCOUNTER — Encounter: Payer: Self-pay | Admitting: Nurse Practitioner

## 2022-09-03 VITALS — BP 122/82 | HR 72 | Temp 98.0°F | Resp 16 | Ht 72.0 in | Wt 243.5 lb

## 2022-09-03 DIAGNOSIS — I493 Ventricular premature depolarization: Secondary | ICD-10-CM | POA: Diagnosis not present

## 2022-09-03 DIAGNOSIS — R0683 Snoring: Secondary | ICD-10-CM

## 2022-09-03 DIAGNOSIS — Z0000 Encounter for general adult medical examination without abnormal findings: Secondary | ICD-10-CM

## 2022-09-03 DIAGNOSIS — E669 Obesity, unspecified: Secondary | ICD-10-CM | POA: Diagnosis not present

## 2022-09-03 LAB — CBC
HCT: 48.3 % (ref 39.0–52.0)
Hemoglobin: 16.6 g/dL (ref 13.0–17.0)
MCHC: 34.5 g/dL (ref 30.0–36.0)
MCV: 92.6 fl (ref 78.0–100.0)
Platelets: 214 10*3/uL (ref 150.0–400.0)
RBC: 5.21 Mil/uL (ref 4.22–5.81)
RDW: 14.5 % (ref 11.5–15.5)
WBC: 7.2 10*3/uL (ref 4.0–10.5)

## 2022-09-03 LAB — LIPID PANEL
Cholesterol: 190 mg/dL (ref 0–200)
HDL: 44.4 mg/dL (ref >39.00)
LDL Cholesterol: 123 mg/dL — ABNORMAL HIGH (ref 0–99)
NonHDL: 146.01
Total CHOL/HDL Ratio: 4
Triglycerides: 115 mg/dL (ref 0.0–149.0)
VLDL: 23 mg/dL (ref 0.0–40.0)

## 2022-09-03 LAB — COMPREHENSIVE METABOLIC PANEL
ALT: 57 U/L — ABNORMAL HIGH (ref 0–53)
AST: 31 U/L (ref 0–37)
Albumin: 4.2 g/dL (ref 3.5–5.2)
Alkaline Phosphatase: 60 U/L (ref 39–117)
BUN: 9 mg/dL (ref 6–23)
CO2: 27 mEq/L (ref 19–32)
Calcium: 9.7 mg/dL (ref 8.4–10.5)
Chloride: 103 mEq/L (ref 96–112)
Creatinine, Ser: 0.95 mg/dL (ref 0.40–1.50)
GFR: 96.99 mL/min (ref >60.00)
Glucose, Bld: 75 mg/dL (ref 70–99)
Potassium: 4 mEq/L (ref 3.5–5.1)
Sodium: 138 mEq/L (ref 135–145)
Total Bilirubin: 0.5 mg/dL (ref 0.2–1.2)
Total Protein: 7.6 g/dL (ref 6.0–8.3)

## 2022-09-03 LAB — HEMOGLOBIN A1C: Hgb A1c MFr Bld: 5.8 % (ref 4.6–6.5)

## 2022-09-03 LAB — TSH: TSH: 2.2 u[IU]/mL (ref 0.35–5.50)

## 2022-09-03 NOTE — Assessment & Plan Note (Signed)
Patient states he does snore.  His wife is concerned for sleep apnea and would like referral for testing.  Amatory referral made to pulmonology in [place]

## 2022-09-03 NOTE — Progress Notes (Signed)
New Patient Office Visit  Subjective    Patient ID: Jeremy Owens, male    DOB: 1976-03-30  Age: 47 y.o. MRN: [id number]  CC:  Chief Complaint  Patient presents with   Establish Care    HPI Jeremy Owens presents to establish care  PVC: patient is on metoprolol and sees Glenetta Hew, MD  for complete physical and follow up of chronic conditions.  Immunizations: -Tetanus: Completed in 2017 -Influenza: refused -Shingles: Too young -Pneumonia: Too young Covid: refused   Diet: Fair diet. 2 meals a day, skips breakfast. States that he will snack. He likes protein ball. States coffee water and sweet tea sometimes soda Exercise: No regular exercise. Will go to the gym on the weekends. States 3 weekends a month. Will be there 1.5 hours   Eye exam: PRN Dental exam: PRN  Colonoscopy: Completed in 2022, repeat 5 2027 Lung Cancer Screening: N/A  PSA: Due  Sleep: states that he goes to bed aroun 10 and get up aroun 6am. Feels rested. Does snore. Would request     Outpatient Encounter Medications as of 09/03/2022  Medication Sig   metoprolol tartrate (LOPRESSOR) 25 MG tablet Take 0.5 tablet (12.5 mg) by mouth twice daily as needed for PVC's   [DISCONTINUED] diazepam (VALIUM) 10 MG tablet Take 1 hour prior to procedure, need a driver on day of (Patient not taking: Reported on 12/21/2021)   No facility-administered encounter medications on file as of 09/03/2022.    Past Medical History:  Diagnosis Date   COVID-19 2022   Diverticulitis    History of kidney stones    History of nephrolithiasis     Past Surgical History:  Procedure Laterality Date   COLON SURGERY     COLONOSCOPY WITH PROPOFOL N/A 03/02/2021   Procedure: COLONOSCOPY WITH BIOPSY;  Surgeon: Lucilla Lame, MD;  Location: [facility];  Service: Endoscopy;  Laterality: N/A;   CYSTOSCOPY  03/30/2021   Procedure: CYSTOSCOPY;  Surgeon: Billey Co, MD;  Location: [facility];  Service: Urology;;    CYSTOSCOPY WITH BIOPSY N/A 02/06/2021   Procedure: CYSTOSCOPY WITH BLADDER BIOPSY;  Surgeon: Hollice Espy, MD;  Location: [facility];  Service: Urology;  Laterality: N/A;   HARDWARE REMOVAL Right 2011   KNEE ARTHROSCOPY Right 2017   clean out meniscus   KNEE ARTHROSCOPY Right 2015   cleaned out meniscus   KNEE ARTHROSCOPY Right    clean out meniscus   KNEE ARTHROSCOPY Right 12/2018   replaced broken hardware   KNEE ARTHROSCOPY Right 10/06/2020   partial medial meniscectomy   MEDIAL PARTIAL KNEE REPLACEMENT Right 2013   ORIF  2010   right knee; injury from work   OTHER SURGICAL HISTORY Right 1996   right elbow nerve decompression   POLYPECTOMY N/A 03/02/2021   Procedure: POLYPECTOMY;  Surgeon: Lucilla Lame, MD;  Location: [facility];  Service: Endoscopy;  Laterality: N/A;   ULNAR NERVE TRANSPOSITION Right 05/15/2017   Procedure: Right elbow ulnar nerve decompression and/or transposition and nerve wrapping;  Surgeon: Iran Planas, MD;  Location: [facility];  Service: Orthopedics;  Laterality: Right;  90 mins   VASECTOMY     WISDOM TOOTH EXTRACTION      Family History  Problem Relation Age of Onset   Heart disease Mother    Diabetes Father    Prostate cancer Neg Hx    Kidney cancer Neg Hx    Bladder Cancer Neg Hx     Social History   Socioeconomic  History   Marital status: Married    Spouse name: Caryl Pina   Number of children: 2   Years of education: Not on file   Highest education level: Not on file  Occupational History   Not on file  Tobacco Use   Smoking status: Never   Smokeless tobacco: Former    Types: Chew    Quit date: 12/2020  Vaping Use   Vaping Use: Never used  Substance and Sexual Activity   Alcohol use: Not Currently    Alcohol/week: 4.0 standard drinks of alcohol    Types: 4 Cans of beer per week    Comment: occ   Drug use: No   Sexual activity: Yes  Other Topics Concern   Not on file  Social History Narrative   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  Respiratory:  Negative for shortness of breath.   Cardiovascular:  Positive for palpitations. Negative for chest pain and leg swelling.  Gastrointestinal:  Negative for abdominal pain, blood in stool, constipation, diarrhea, nausea and vomiting.       BM daily   Genitourinary:  Negative for dysuria and hematuria.  Neurological:  Negative for tingling and headaches.  Psychiatric/Behavioral:  Negative for hallucinations and suicidal ideas.         Objective    BP 122/82   Pulse 72   Temp 98 F (36.7 C)   Resp 16   Ht 6' (1.829 m)   Wt 243 lb 8 oz (110.5 kg)   SpO2 98%   BMI 33.02 kg/m   Physical Exam Vitals and nursing note reviewed. Exam conducted with a chaperone present ([place]).  Constitutional:      Appearance: Normal appearance.  HENT:     Right Ear: Tympanic membrane, ear canal and external ear normal.     Left Ear: Tympanic membrane, ear canal and external ear normal.     Mouth/Throat:     Mouth: Mucous membranes are moist.     Pharynx: Oropharynx is clear.  Eyes:     Extraocular Movements: Extraocular movements intact.     Pupils: Pupils are equal, round, and reactive to light.  Cardiovascular:     Rate and Rhythm: Normal rate and regular rhythm.     Pulses: Normal pulses.     Heart sounds: Normal heart sounds.  Pulmonary:     Effort: Pulmonary effort is normal.     Breath sounds: Normal breath sounds.  Abdominal:     General: Bowel sounds are normal. There is no distension.     Palpations: There is no mass.     Tenderness:  There is no abdominal tenderness.     Hernia: No hernia is present. There is no hernia in the left inguinal area or right inguinal area.  Genitourinary:    Penis: Normal.      Testes: Normal.     Epididymis:     Right: Normal.     Left: Normal.  Musculoskeletal:     Right lower  leg: No edema.     Left lower leg: No edema.  Lymphadenopathy:     Cervical: No cervical adenopathy.     Lower Body: No right inguinal adenopathy. No left inguinal adenopathy.  Skin:    General: Skin is warm.  Neurological:     General: No focal deficit present.     Mental Status: He is alert.     Deep Tendon Reflexes:     Reflex Scores:      Bicep reflexes are 2+ on the right side and 2+ on the left side.      Patellar reflexes are 2+ on the right side and 2+ on the left side.    Comments: Bilateral upper and lower extremity strength 5/5  Psychiatric:        Mood and Affect: Mood normal.        Behavior: Behavior normal.        Thought Content: Thought content normal.        Judgment: Judgment normal.         Assessment & Plan:   Problem List Items Addressed This Visit       Cardiovascular and Mediastinum   PVC (premature ventricular contraction) - Primary    Patient is followed by Glenetta Hew, MD.  He does have 12.5 mg of metoprolol he will take as needed.  Continue following with cardiology and taking medication as prescribed.      Relevant Orders   CBC   Comprehensive metabolic panel   TSH     Other   Preventative health care    Discussed age-appropriate immunizations and screening exams.  Patient is up-to-date on Tdap, refused the flu vaccine, refused COVID-vaccine.  Patient is up-to-date on colonoscopy is not due until 2027.  He is too young for prostate screening with his risk factors.  Patient was given information at discharge about preventative healthcare maintenance with anticipatory guidance.      Relevant Orders   CBC   Comprehensive metabolic panel   Obesity (BMI  30-39.9)    Patient to continue working on lifestyle modifications inclusive of exercise.  Pending A1c      Relevant Orders   Hemoglobin A1c   TSH   Lipid panel   Snoring    Patient states he does snore.  His wife is concerned for sleep apnea and would like referral for testing.  Amatory referral made to pulmonology in [facility]      Relevant Orders   Ambulatory referral to Pulmonology    Return in about 1 year (around 09/03/2023) for CPE and Labs.   Romilda Garret, NP

## 2022-09-03 NOTE — Assessment & Plan Note (Signed)
Discussed age-appropriate immunizations and screening exams.  Patient is up-to-date on Tdap, refused the flu vaccine, refused COVID-vaccine.  Patient is up-to-date on colonoscopy is not due until 2027.  He is too young for prostate screening with his risk factors.  Patient was given information at discharge about preventative healthcare maintenance with anticipatory guidance.

## 2022-09-03 NOTE — Patient Instructions (Signed)
Nice to see you today I will be in touch with the labs once I have the results Make an appointment with Dr Frederico Hamman Copland at your leisure to discuss your foot I want to see you in a year for your next physical, sooner if you need me

## 2022-09-03 NOTE — Assessment & Plan Note (Signed)
Patient is followed by Glenetta Hew, MD.  He does have 12.5 mg of metoprolol he will take as needed.  Continue following with cardiology and taking medication as prescribed.

## 2022-09-03 NOTE — Assessment & Plan Note (Signed)
Patient to continue working on lifestyle modifications inclusive of exercise.  Pending A1c

## 2022-09-18 ENCOUNTER — Institutional Professional Consult (permissible substitution): Payer: BC Managed Care – PPO | Admitting: Primary Care

## 2022-09-24 ENCOUNTER — Encounter: Payer: Self-pay | Admitting: Primary Care

## 2022-09-24 ENCOUNTER — Ambulatory Visit (INDEPENDENT_AMBULATORY_CARE_PROVIDER_SITE_OTHER): Payer: BC Managed Care – PPO | Admitting: Primary Care

## 2022-09-24 VITALS — BP 120/80 | HR 83 | Ht 72.0 in | Wt 245.6 lb

## 2022-09-24 DIAGNOSIS — R0683 Snoring: Secondary | ICD-10-CM

## 2022-09-24 NOTE — Progress Notes (Signed)
Reviewed and agree with assessment/plan.   Chesley Mires, MD [facility] Pulmonary/Critical Care 09/24/2022, 12:22 PM Pager:  [phone]

## 2022-09-24 NOTE — Assessment & Plan Note (Signed)
-   Patient has symptoms of loud snoring and witnessed apnea.  No significant associated daytime sleepiness. Epworth score 3/24. BMI 33. Concern patient could have underlying sleep apnea, needs sleep study to evaluate.  We discussed risks of untreated sleep apnea pleating cardiac arrhythmias, pulmonary hypertension, diabetes and stroke.  We also discussed treatment options including weight loss, oral appliance, CPAP therapy referral to ENT for possible surgical options.  Encouraged weight loss efforts.  Advised patient continue to focus on side sleeping position.  Follow-up 1 to 2 weeks after sleep study to review results and treatment options if needed.

## 2022-09-24 NOTE — Progress Notes (Signed)
@Patient  ID: Jeremy Owens, male    DOB: [birth year], 47 y.o.   MRN: [id number]  Chief Complaint  Patient presents with   Consult    No sleep study  Epworth 3    Referring provider: Michela Pitcher, NP  HPI: 47 year, never smoked. PMH obesity, snoring, PVC, s/p lap colectomy, hx right knee arthoplasty.   09/24/2022- Sleep consult  Patient presents today for sleep consult. He has symptoms of loud snoring and witnessed apnea. His wife has witnessed him stop breathing and irregular breathing pattern. She has told him that he snores loudly. 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 Occasionally will pick up a night shift but does not consistently. He is not particularly impacted by his sleep quality. No significant daytime sleepiness. No issues falling or staying asleep. He wakes up around 3am to use the restroom. He sustained knee injury September 2010. Knee pain can wake him up or cause him to need to move/change position at night. He had nerve ablation done in August 2023.  He sleeps with body pillow. He sleeps on his side primary. Hx partial knee replacement. He has put weight on the last year. He had lap colectomy in 2022 and since then has not been making good food choices.  He knows he can do better with this. Denies symptoms of narcolepsy, cataplexy or sleep walking.   Sleep questionnaire:  Symptoms- loud snoring, witnessed apnea     Prior sleep study- none  Bedtime-10-11pm Time to fall asleep- not long  Nocturnal awakenings- 1-2 times  Out of bed/start of day- 5:30-6 am Weight changes- up 30 lbs Do you operate heavy machinery- no Do you currently wear CPAP- no Do you current wear oxygen- no Epworth- 3  Social hx: Patient is married.  He lives with his wife and kids.  He works as a Engineer, structural.  No recent travel in the last 3 months.  He quit drinking alcohol.  No history of tobacco abuse.  No Known Allergies  Immunization History  Administered Date(s) Administered    Tdap 09/11/2015    Past Medical History:  Diagnosis Date   COVID-19 2022   Diverticulitis    Family history of polyps in the colon    History of kidney stones    History of nephrolithiasis     Tobacco History: Social History   Tobacco Use  Smoking Status Never  Smokeless Tobacco Former   Types: Chew   Quit date: 12/2020   Counseling given: Not Answered   Outpatient Medications Prior to Visit  Medication Sig Dispense Refill   metoprolol tartrate (LOPRESSOR) 25 MG tablet Take 0.5 tablet (12.5 mg) by mouth twice daily as needed for PVC's 30 tablet 1   No facility-administered medications prior to visit.   Review of Systems  Review of Systems  Constitutional:  Positive for unexpected weight change. Negative for fatigue.  HENT: Negative.    Respiratory: Negative.    Psychiatric/Behavioral:  Positive for sleep disturbance.     Physical Exam  BP 120/80 (BP Location: Left Arm, Patient Position: Sitting, Cuff Size: Large)   Pulse 83   Ht 6' (1.829 m)   Wt 245 lb 9.6 oz (111.4 kg)   SpO2 98%   BMI 33.31 kg/m  Physical Exam Constitutional:      General: He is not in acute distress.    Appearance: Normal appearance. He is not ill-appearing.  HENT:     Head: Normocephalic and atraumatic.  Mouth/Throat:     Mouth: Mucous membranes are moist.     Pharynx: Oropharynx is clear.     Comments: Mallampati class III Cardiovascular:     Rate and Rhythm: Normal rate and regular rhythm.  Pulmonary:     Effort: Pulmonary effort is normal.     Breath sounds: Normal breath sounds.  Skin:    General: Skin is warm and dry.  Neurological:     General: No focal deficit present.     Mental Status: He is alert and oriented to person, place, and time. Mental status is at baseline.  Psychiatric:        Mood and Affect: Mood normal.        Behavior: Behavior normal.        Thought Content: Thought content normal.        Judgment: Judgment normal.      Lab Results:  CBC     Component Value Date/Time   WBC 7.2 09/03/2022 1015   RBC 5.21 09/03/2022 1015   HGB 16.6 09/03/2022 1015   HGB 16.8 02/02/2021 0854   HCT 48.3 09/03/2022 1015   HCT 49.7 02/02/2021 0854   PLT 214.0 09/03/2022 1015   PLT 224 02/02/2021 0854   MCV 92.6 09/03/2022 1015   MCV 93 02/02/2021 0854   MCH 31.0 12/10/2021 1138   MCHC 34.5 09/03/2022 1015   RDW 14.5 09/03/2022 1015   RDW 12.6 02/02/2021 0854   LYMPHSABS 0.9 03/04/2021 2017   LYMPHSABS 1.3 02/02/2021 0854   MONOABS 1.0 03/04/2021 2017   EOSABS 0.0 03/04/2021 2017   EOSABS 0.1 02/02/2021 0854   BASOSABS 0.0 03/04/2021 2017   BASOSABS 0.0 02/02/2021 0854    BMET    Component Value Date/Time   NA 138 09/03/2022 1015   NA 138 02/02/2021 0854   K 4.0 09/03/2022 1015   CL 103 09/03/2022 1015   CO2 27 09/03/2022 1015   GLUCOSE 75 09/03/2022 1015   BUN 9 09/03/2022 1015   BUN 10 02/02/2021 0854   CREATININE 0.95 09/03/2022 1015   CALCIUM 9.7 09/03/2022 1015   GFRNONAA >60 12/10/2021 1138   GFRAA >60 10/19/2017 0817    BNP No results found for: "BNP"  ProBNP No results found for: "PROBNP"  Imaging: No results found.   Assessment & Plan:   Loud snoring - Patient has symptoms of loud snoring and witnessed apnea.  No significant associated daytime sleepiness. Epworth score 3/24. BMI 33. Concern patient could have underlying sleep apnea, needs sleep study to evaluate.  We discussed risks of untreated sleep apnea pleating cardiac arrhythmias, pulmonary hypertension, diabetes and stroke.  We also discussed treatment options including weight loss, oral appliance, CPAP therapy referral to ENT for possible surgical options.  Encouraged weight loss efforts.  Advised patient continue to focus on side sleeping position.  Follow-up 1 to 2 weeks after sleep study to review results and treatment options if needed.    Martyn Ehrich, NP 09/24/2022

## 2022-09-24 NOTE — Patient Instructions (Addendum)
Sleep apnea is defined as period of 10 seconds or longer when you stop breathing at night. This can happen multiple times a night. Dx sleep apnea is when this occurs more than 5 times an hour.    Mild OSA 5-15 apneic events an hour Moderate OSA 15-30 apneic events an hour Severe OSA > 30 apneic events an hour   Untreated sleep apnea puts you at higher risk for cardiac arrhythmias, pulmonary HTN, stroke and diabetes  Treatment options include weight loss, side sleeping position, oral appliance, CPAP therapy or referral to ENT for possible surgical options    Recommendations: Focus on side sleeping position or elevate head with wedge pillow 30 degrees Work on weight loss efforts if able  Do not drive if experiencing excessive daytime sleepiness of fatigue    Orders: Home sleep study re: loud snoring    Follow-up: Please call to schedule follow-up 1-2 weeks after completing home sleep study to review results and treatment if needed (can be virtual)  Sleep Apnea Sleep apnea affects breathing during sleep. It causes breathing to stop for 10 seconds or more, or to become shallow. People with sleep apnea usually snore loudly. It can also increase the risk of: Heart attack. Stroke. Being very overweight (obese). Diabetes. Heart failure. Irregular heartbeat. High blood pressure. The goal of treatment is to help you breathe normally again. What are the causes?  The most common cause of this condition is a collapsed or blocked airway. There are three kinds of sleep apnea: Obstructive sleep apnea. This is caused by a blocked or collapsed airway. Central sleep apnea. This happens when the brain does not send the right signals to the muscles that control breathing. Mixed sleep apnea. This is a combination of obstructive and central sleep apnea. What increases the risk? Being overweight. Smoking. Having a small airway. Being older. Being male. Drinking alcohol. Taking medicines to  calm yourself (sedatives or tranquilizers). Having family members with the condition. Having a tongue or tonsils that are larger than normal. What are the signs or symptoms? Trouble staying asleep. Loud snoring. Headaches in the morning. Waking up gasping. Dry mouth or sore throat in the morning. Being sleepy or tired during the day. If you are sleepy or tired during the day, you may also: Not be able to focus your mind (concentrate). Forget things. Get angry a lot and have mood swings. Feel sad (depressed). Have changes in your personality. Have less interest in sex, if you are male. Be unable to have an erection, if you are male. How is this treated?  Sleeping on your side. Using a medicine to get rid of mucus in your nose (decongestant). Avoiding the use of alcohol, medicines to help you relax, or certain pain medicines (narcotics). Losing weight, if needed. Changing your diet. Quitting smoking. Using a machine to open your airway while you sleep, such as: An oral appliance. This is a mouthpiece that shifts your lower jaw forward. A CPAP device. This device blows air through a mask when you breathe out (exhale). An EPAP device. This has valves that you put in each nostril. A BIPAP device. This device blows air through a mask when you breathe in (inhale) and breathe out. Having surgery if other treatments do not work. Follow these instructions at home: Lifestyle Make changes that your doctor recommends. Eat a healthy diet. Lose weight if needed. Avoid alcohol, medicines to help you relax, and some pain medicines. Do not smoke or use any products that contain   nicotine or tobacco. If you need help quitting, ask your doctor. General instructions Take over-the-counter and prescription medicines only as told by your doctor. If you were given a machine to use while you sleep, use it only as told by your doctor. If you are having surgery, make sure to tell your doctor you have  sleep apnea. You may need to bring your device with you. Keep all follow-up visits. Contact a doctor if: The machine that you were given to use during sleep bothers you or does not seem to be working. You do not get better. You get worse. Get help right away if: Your chest hurts. You have trouble breathing in enough air. You have an uncomfortable feeling in your back, arms, or stomach. You have trouble talking. One side of your body feels weak. A part of your face is hanging down. These symptoms may be an emergency. Get help right away. Call your local emergency services (911 in the [place] Do not wait to see if the symptoms will go away. Do not drive yourself to the hospital. Summary This condition affects breathing during sleep. The most common cause is a collapsed or blocked airway. The goal of treatment is to help you breathe normally while you sleep. This information is not intended to replace advice given to you by your health care provider. Make sure you discuss any questions you have with your health care provider. Document Revised: 01/25/2021 Document Reviewed: 05/27/2020 Elsevier Patient Education  2023 Elsevier Inc.   

## 2022-10-02 ENCOUNTER — Ambulatory Visit: Payer: BC Managed Care – PPO | Admitting: Primary Care

## 2022-10-02 DIAGNOSIS — G4733 Obstructive sleep apnea (adult) (pediatric): Secondary | ICD-10-CM | POA: Diagnosis not present

## 2022-10-02 DIAGNOSIS — R0683 Snoring: Secondary | ICD-10-CM

## 2022-10-05 DIAGNOSIS — G4733 Obstructive sleep apnea (adult) (pediatric): Secondary | ICD-10-CM

## 2022-10-08 NOTE — Progress Notes (Signed)
Sleep study showed moderate obstructive sleep apnea, he had an average of 15 apneic events an hour.  Patients has an appointment April 26 with me.  See if he can move this visit up to discussed treatment options sooner. If not able ok to wait

## 2022-10-26 ENCOUNTER — Telehealth (INDEPENDENT_AMBULATORY_CARE_PROVIDER_SITE_OTHER): Payer: BC Managed Care – PPO | Admitting: Primary Care

## 2022-10-26 DIAGNOSIS — G4733 Obstructive sleep apnea (adult) (pediatric): Secondary | ICD-10-CM

## 2022-10-26 NOTE — Patient Instructions (Addendum)
HST 10/02/22 showed moderate OSA, AHI 15.4/hour with SpO2 low 78% (average 93%    Since patient is mildly impacted by his sleep recommending patient be considered for oral appliance to help with snoring and mild- moderate apneas  Referral has been placed to orthodontics  Advised patient focus on side sleeping position and work on weight loss efforts  Follow-up in 6 months or sooner if needed

## 2022-10-26 NOTE — Progress Notes (Signed)
Virtual Visit via Video Note  I connected with Jeremy Owens on 10/26/22 at  8:30 AM EDT by a video enabled telemedicine application and verified that I am speaking with the correct person using two identifiers.  Location: Patient: Home Provider: Office    I discussed the limitations of evaluation and management by telemedicine and the availability of in person appointments. The patient expressed understanding and agreed to proceed.  History of Present Illness: 47 year, never smoked. PMH obesity, snoring, PVC, s/p lap colectomy, hx right knee arthoplasty.   Previous [facility] pulmonary encounter: 09/24/2022- Sleep consult  Patient presents today for sleep consult. He has symptoms of loud snoring and witnessed apnea. His wife has witnessed him stop breathing and irregular breathing pattern. She has told him that he snores loudly. 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 Occasionally will pick up a night shift but does not consistently. He is not particularly impacted by his sleep quality. No significant daytime sleepiness. No issues falling or staying asleep. He wakes up around 3am to use the restroom. He sustained knee injury September 2010. Knee pain can wake him up or cause him to need to move/change position at night. He had nerve ablation done in August 2023.  He sleeps with body pillow. He sleeps on his side primary. Hx partial knee replacement. He has put weight on the last year. He had lap colectomy in 2022 and since then has not been making good food choices.  He knows he can do better with this. Denies symptoms of narcolepsy, cataplexy or sleep walking.   Sleep questionnaire:  Symptoms- loud snoring, witnessed apnea     Prior sleep study- none  Bedtime-10-11pm Time to fall asleep- not long  Nocturnal awakenings- 1-2 times  Out of bed/start of day- 5:30-6 am Weight changes- up 30 lbs Do you operate heavy machinery- no Do you currently wear CPAP- no Do you current wear oxygen-  no Epworth- 3  Social hx: Patient is married.  He lives with his wife and kids.  He works as a Emergency planning/management officer.  No recent travel in the last 3 months.  He quit drinking alcohol.  No history of tobacco abuse.    10/26/2022- Interim hx Patient contacted today to review sleep study results. He has been told by his wife that he snores loudly and has witnessed apneas. He feels he sleeps alright. He is no more tired than most people. HST 10/02/22 showed moderate OSA, AHI 15/hour.  We reviewed sleep study results and treatment options.  Since patient is mildly impacted by his sleep recommending patient be considered for oral appliance to help with snoring and mild to moderate apneas.  He is in agreement with plan.  Encourage side sleeping position and weight loss  Observations/Objective:  Appear well; No overt shortness of breath or wheezing   Assessment and Plan:  Moderate OSA: - Patient has snoring symptoms and witnessed apnea. No significant sleep disruptions or daytime fatigue.  HST 10/02/22 showed moderate OSA, AHI 15.4/hour with SpO2 low 78% (average 93%).  We reviewed sleep study results and treatment options.  Since patient is mildly impacted by his sleep recommending patient be considered for oral appliance to help with snoring and mild- moderate apneas.  Referral has been placed to orthodontics.  Advised patient focus on side sleeping position and work on weight loss efforts.   Follow Up Instructions:  Follow-up in 6 months or sooner if needed.    I discussed the assessment and treatment  plan with the patient. The patient was provided an opportunity to ask questions and all were answered. The patient agreed with the plan and demonstrated an understanding of the instructions.   The patient was advised to call back or seek an in-person evaluation if the symptoms worsen or if the condition fails to improve as anticipated.  I provided 22 minutes of non-face-to-face time during this  encounter.   Glenford Bayley, NP

## 2022-10-26 NOTE — Progress Notes (Signed)
Reviewed and agree with assessment/plan.   Coralyn Helling, MD [facility] Pulmonary/Critical Care 10/26/2022, 9:08 AM Pager:  [phone]

## 2023-06-02 ENCOUNTER — Telehealth: Payer: BC Managed Care – PPO | Admitting: Physician Assistant

## 2023-06-02 DIAGNOSIS — J069 Acute upper respiratory infection, unspecified: Secondary | ICD-10-CM

## 2023-06-02 MED ORDER — FLUTICASONE PROPIONATE 50 MCG/ACT NA SUSP: 2.0000 | Freq: Every day | NASAL | 6 refills | Status: DC

## 2023-06-02 MED ORDER — CETIRIZINE HCL 10 MG PO TABS: 10.0000 mg | ORAL_TABLET | Freq: Every day | ORAL | 0 refills | Status: DC

## 2023-06-02 MED ORDER — BENZONATATE 100 MG PO CAPS: 100.0000 mg | ORAL_CAPSULE | Freq: Two times a day (BID) | ORAL | 0 refills | Status: DC | PRN

## 2023-06-02 NOTE — Patient Instructions (Signed)
  Roland Earl, thank you for joining [name], PA-C for today's virtual visit.  While this provider is not your primary care provider (PCP), if your PCP is located in our provider database this encounter information will be shared with them immediately following your visit.   A [facility] MyChart account gives you access to today's visit and all your visits, tests, and labs performed at [facility] " click here if you don't have a [facility] MyChart account or go to [email or web address]  Consent: (Patient) Roland Earl provided verbal consent for this virtual visit at the beginning of the encounter.  Current Medications:  Current Outpatient Medications:    benzonatate (TESSALON) 100 MG capsule, Take 1 capsule (100 mg total) by mouth 2 (two) times daily as needed for cough., Disp: 20 capsule, Rfl: 0   cetirizine (ZYRTEC ALLERGY) 10 MG tablet, Take 1 tablet (10 mg total) by mouth daily., Disp: 14 tablet, Rfl: 0   fluticasone (FLONASE) 50 MCG/ACT nasal spray, Place 2 sprays into both nostrils daily., Disp: 16 g, Rfl: 6   metoprolol tartrate (LOPRESSOR) 25 MG tablet, Take 0.5 tablet (12.5 mg) by mouth twice daily as needed for PVC's, Disp: 30 tablet, Rfl: 1   Medications ordered in this encounter:  Meds ordered this encounter  Medications   benzonatate (TESSALON) 100 MG capsule    Sig: Take 1 capsule (100 mg total) by mouth 2 (two) times daily as needed for cough.    Dispense:  20 capsule    Refill:  0    Order Specific Question:   Supervising Provider    Answer:   Merrilee Jansky [[id number]]   cetirizine (ZYRTEC ALLERGY) 10 MG tablet    Sig: Take 1 tablet (10 mg total) by mouth daily.    Dispense:  14 tablet    Refill:  0    Order Specific Question:   Supervising Provider    Answer:   Merrilee Jansky [[id number]]   fluticasone (FLONASE) 50 MCG/ACT nasal spray    Sig: Place 2 sprays into both nostrils daily.    Dispense:  16 g    Refill:  6    Order Specific  Question:   Supervising Provider    Answer:   Merrilee Jansky [id number]     *If you need refills on other medications prior to your next appointment, please contact your pharmacy*  Follow-Up: Call back or seek an in-person evaluation if the symptoms worsen or if the condition fails to improve as anticipated.  [facility] Virtual Care [phone]  Other Instructions Take all meds as prescribed. PCP follow up in 3-5 days.   If you have been instructed to have an in-person evaluation today at a local Urgent Care facility, please use the link below. It will take you to a list of all of our available [facility], including address, phone number and hours of operation. Please do not delay care.  [facility]  If you or a family member do not have a primary care provider, use the link below to schedule a visit and establish care. When you choose a [facility] primary care physician or advanced practice provider, you gain a long-term partner in health. Find a Primary Care Provider  Learn more about [facility]'s in-office and virtual care options: [facility] - Get Care Now

## 2023-06-02 NOTE — Progress Notes (Signed)
Virtual Visit Consent   [name], you are scheduled for a virtual visit with a [facility] provider today. Just as with appointments in the office, your consent must be obtained to participate. Your consent will be active for this visit and any virtual visit you may have with one of our providers in the next 365 days. If you have a MyChart account, a copy of this consent can be sent to you electronically.  As this is a virtual visit, video technology does not allow for your provider to perform a traditional examination. This may limit your provider's ability to fully assess your condition. If your provider identifies any concerns that need to be evaluated in person or the need to arrange testing (such as labs, EKG, etc.), we will make arrangements to do so. Although advances in technology are sophisticated, we cannot ensure that it will always work on either your end or our end. If the connection with a video visit is poor, the visit may have to be switched to a telephone visit. With either a video or telephone visit, we are not always able to ensure that we have a secure connection.  By engaging in this virtual visit, you consent to the provision of healthcare and authorize for your insurance to be billed (if applicable) for the services provided during this visit. Depending on your insurance coverage, you may receive a charge related to this service.  I need to obtain your verbal consent now. Are you willing to proceed with your visit today? [name] has provided verbal consent on 06/02/2023 for a virtual visit (video or telephone). Jeremy Owens, [place]  Date: 06/02/2023 11:51 AM  Virtual Visit via Video Note   I, Jeremy Owens, connected with  [name]  ([id number], July 18, 1975) on 06/02/23 at 11:45 AM EST by a video-enabled telemedicine application and verified that I am speaking with the correct person using two identifiers.  Location: Patient: Virtual Visit Location Patient:  Home Provider: Virtual Visit Location Provider: Home Office   I discussed the limitations of evaluation and management by telemedicine and the availability of in person appointments. The patient expressed understanding and agreed to proceed.    History of Present Illness: Jeremy Owens is a 47 y.o. who identifies as a male who was assigned male at birth, and is being seen today for uri.  HPI: URI  This is a new problem. The current episode started in the past 7 days. The problem has been unchanged. There has been no fever. Associated symptoms include coughing, rhinorrhea and a sore throat. Treatments tried: OTC meds. The treatment provided mild relief.    Problems:  Patient Active Problem List   Diagnosis Date Noted   Preventative health care 09/03/2022   Obesity (BMI 30-39.9) 09/03/2022   Loud snoring 09/03/2022   PVC (premature ventricular contraction) 12/21/2021   S/P laparoscopic colectomy 03/30/2021   Diverticulitis of colon    Rectal polyp    History of nephrolithiasis 01/12/2021   Pain in right knee 04/28/2018   Knee pain 10/30/2017   Localized, primary osteoarthritis 10/30/2017   Osteoarthritis of knee 03/07/2017   Right knee injury    Synovitis of knee 01/11/2016   History of total knee arthroplasty 01/11/2016   H/O elbow surgery 03/08/1995    Allergies: No Known Allergies Medications:  Current Outpatient Medications:    metoprolol tartrate (LOPRESSOR) 25 MG tablet, Take 0.5 tablet (12.5 mg) by mouth twice daily as needed for PVC's, Disp: 30 tablet,  Rfl: 1  Observations/Objective: Patient is well-developed, well-nourished in no acute distress.  Resting comfortably  at home.  Head is normocephalic, atraumatic.  No labored breathing.  Speech is clear and coherent with logical content.  Patient is alert and oriented at baseline.    Assessment and Plan: 1. Viral URI with cough  This  patient presents with symptoms suspicious for likely viral upper respiratory  infection. Differential includes bacterial pneumonia, sinusitis, allergic rhinitis. Do not suspect underlying cardiopulmonary process. I considered, but think unlikely, dangerous causes of this patient's symptoms to include ACS, CHF or COPD exacerbations, pneumonia, pneumothorax. Patient is nontoxic appearing and not in need of emergent medical intervention.  Plan: reassurance, reassessment, over the counter medications, discharge with PCP followup  Follow Up Instructions: I discussed the assessment and treatment plan with the patient. The patient was provided an opportunity to ask questions and all were answered. The patient agreed with the plan and demonstrated an understanding of the instructions.  A copy of instructions were sent to the patient via MyChart unless otherwise noted below.    The patient was advised to call back or seek an in-person evaluation if the symptoms worsen or if the condition fails to improve as anticipated.    Jeremy Kidney, PA-C

## 2023-06-28 IMAGING — CT CT ABD-PEL WO/W CM
3 of 8 series · 15 of 46 positions shown, 17 images · IV contrast (omnipaque)
Comparison: 04/26/2020

CLINICAL DATA: Hematuria.

EXAM:
CT ABDOMEN AND PELVIS WITHOUT AND WITH CONTRAST
TECHNIQUE: Multidetector CT imaging of the abdomen and pelvis was performed
following the standard protocol before and following the bolus
administration of intravenous contrast.
CONTRAST:  100mL OMNIPAQUE IOHEXOL 350 MG/ML SOLN

[Series 2: axial without without pre 5.00 · axial · non-contrast · 0.85mm/px · z∈[-1537,-1132]mm · 10 of 101 slices shown, 12 images]
[im 10/101  soft-tissue]
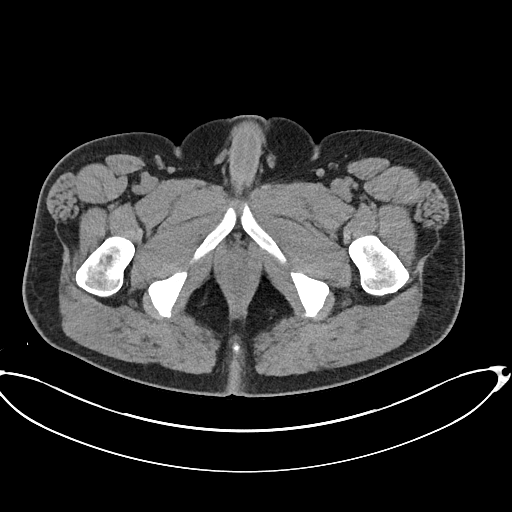
[im 10/101  bone]
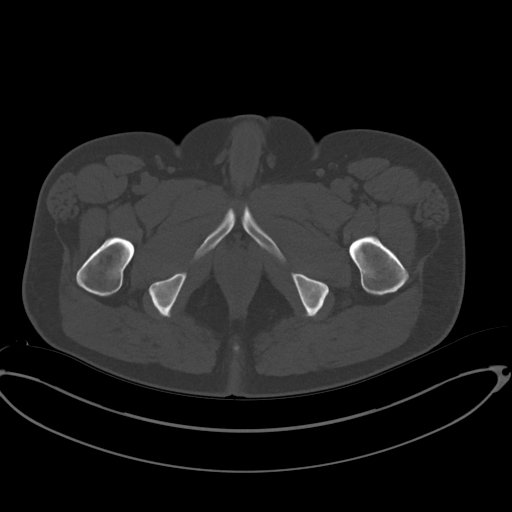
[im 19/101  soft-tissue]
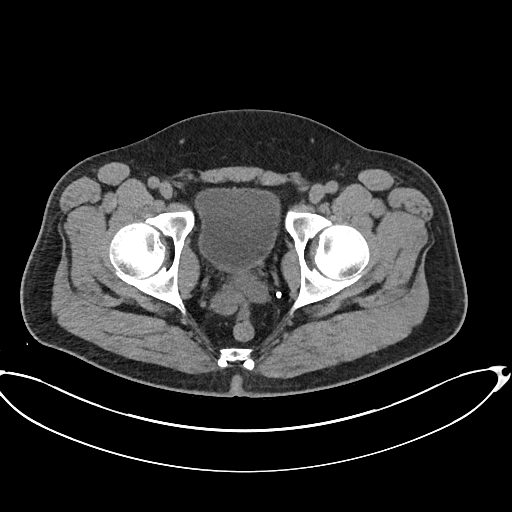
[im 28/101  soft-tissue]
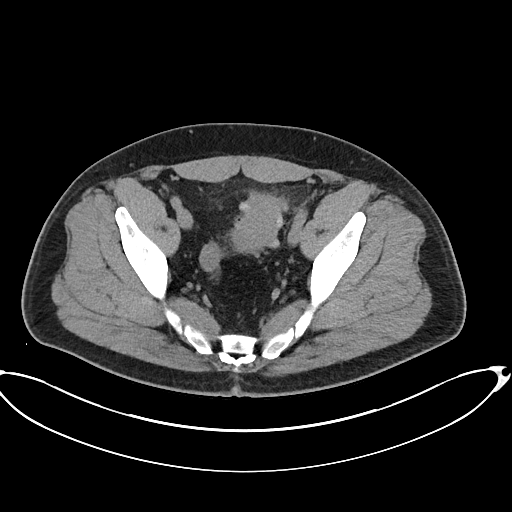
[im 37/101  soft-tissue]
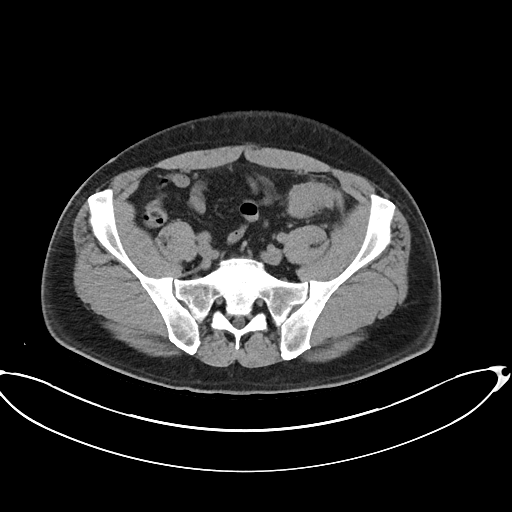
[im 46/101  soft-tissue]
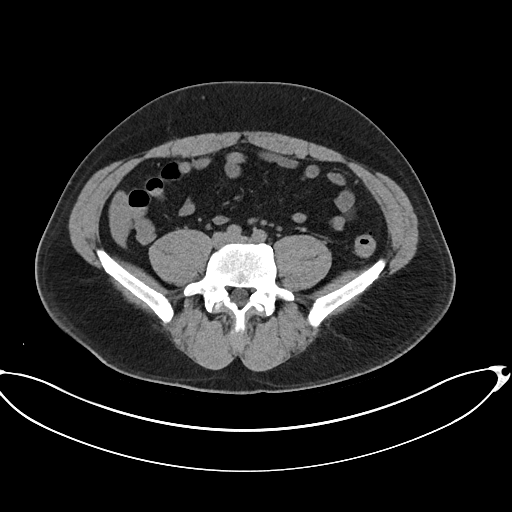
[im 55/101  soft-tissue]
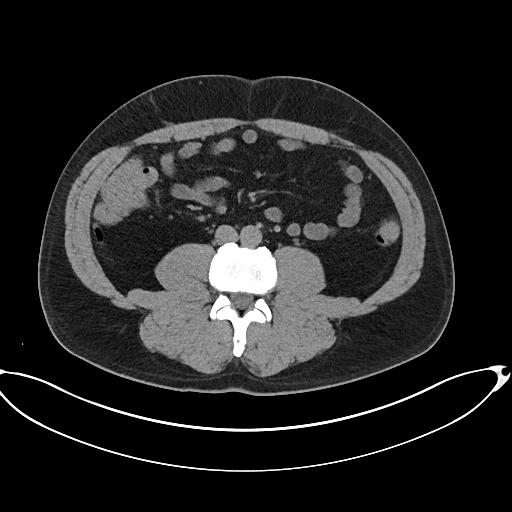
[im 64/101  soft-tissue]
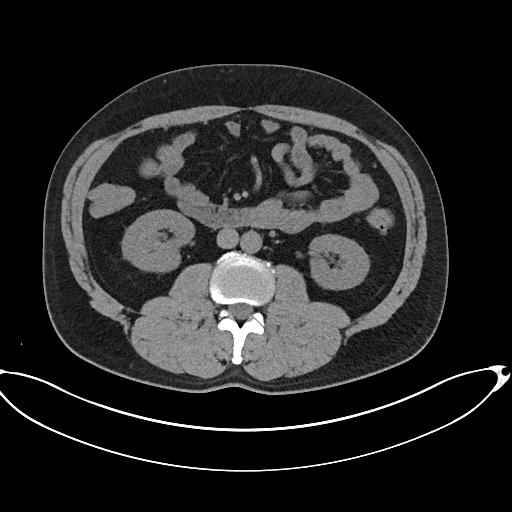
[im 73/101  soft-tissue]
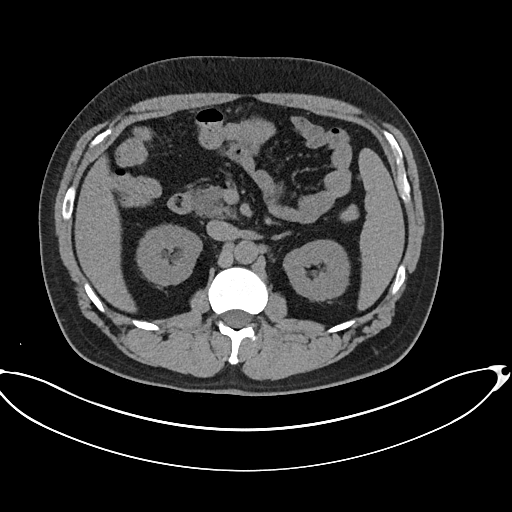
[im 82/101  soft-tissue]
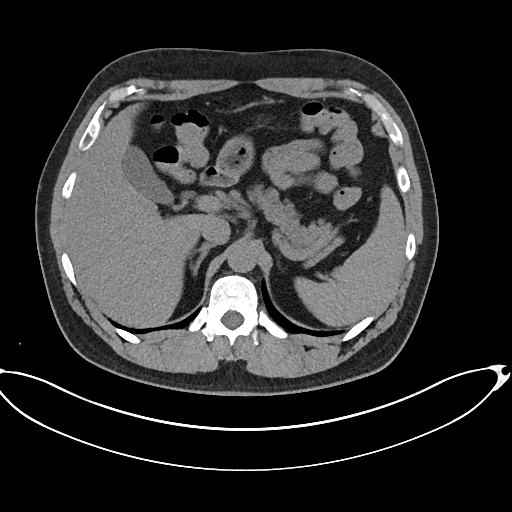
[im 82/101  bone]
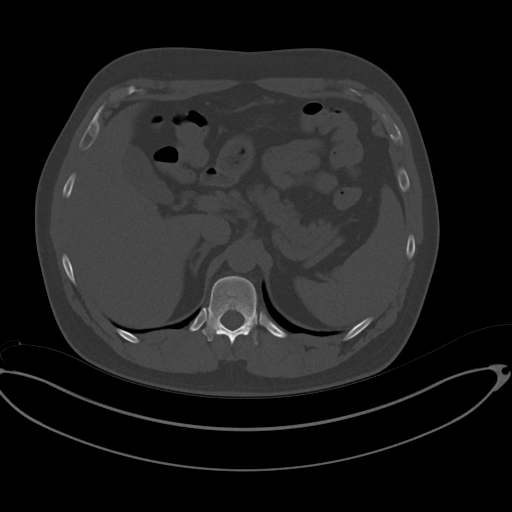
[im 91/101  soft-tissue]
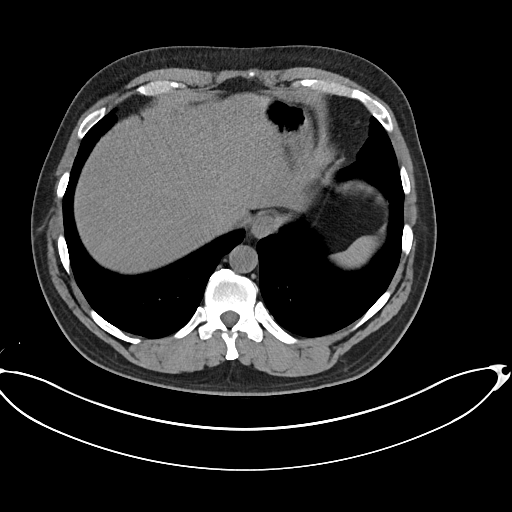

[Series 4: lung without without pre 5.00 · axial · non-contrast · 0.85mm/px · z∈[-1182,-1132]mm · 2 of 31 slices shown]
[im 11/31  bone]
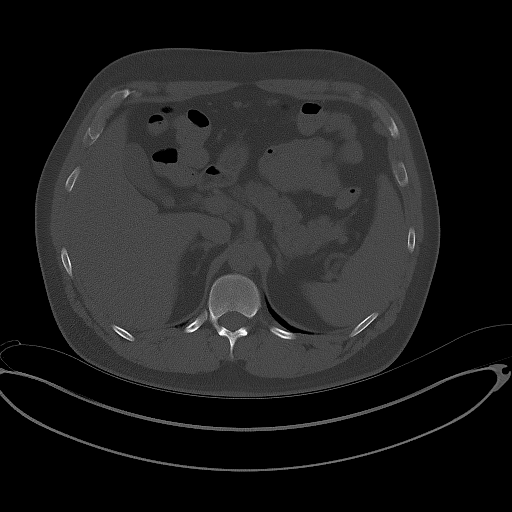
[im 21/31  bone]
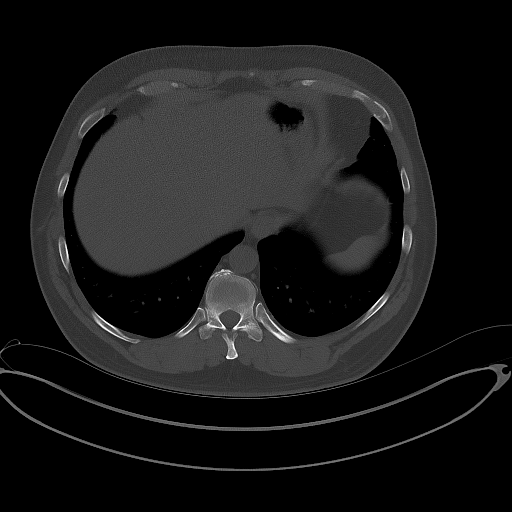

[Series 5: cor without without pre 2.00 cor · coronal · non-contrast · 0.78mm/px · 3 of 153 slices shown]
[im 39/153  soft-tissue]
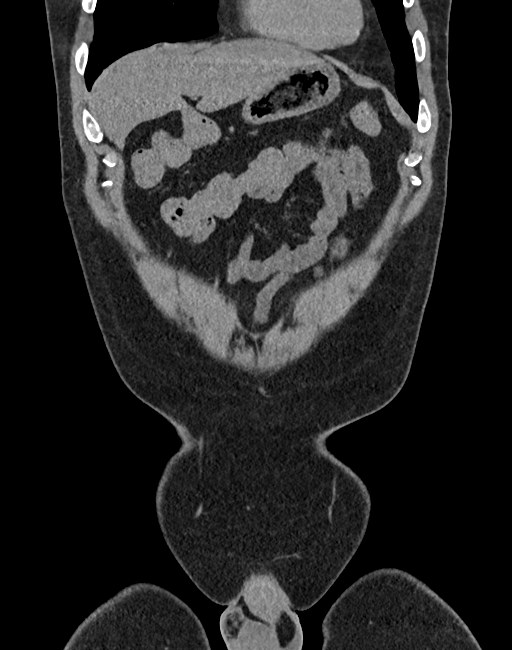
[im 77/153  soft-tissue]
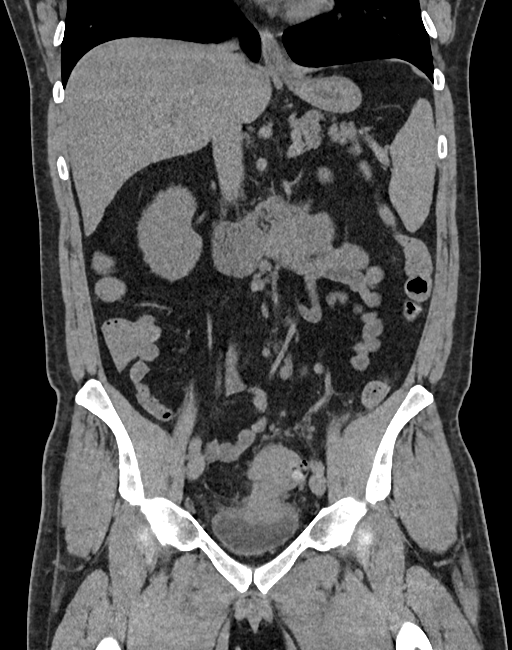
[im 115/153  soft-tissue]
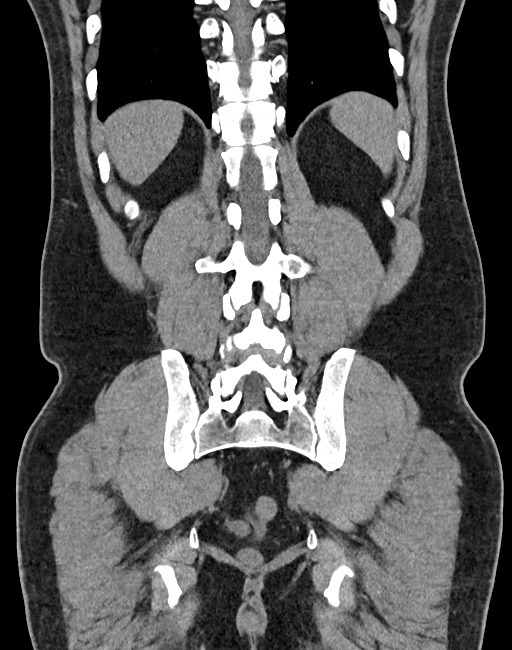

[15 of 46 positions shown; findings below may reference images not displayed]

FINDINGS: Lower chest: No acute abnormality.

Hepatobiliary: There is hepatic steatosis. Tiny low-density
structure in the posterior dome of liver measures 6 mm and is too
small to characterize, image [DATE]. Gallbladder is normal. No bile
duct dilatation.

Pancreas: Unremarkable. No pancreatic ductal dilatation or
surrounding inflammatory changes.

Spleen: Upper limits of normal measuring 12.4 cm in cranial caudal
dimension, image 92/5. No focal splenic lesion identified.

Adrenals/Urinary Tract: Normal adrenal glands. No kidney stones
identified bilaterally. Tiny low-attenuation structure within the
lateral cortex of the left mid kidney is too small to characterize
measuring 4 mm. Additionally there are 2 small low-density foci,
also too small to characterize within the inferior pole of the left
kidney measuring up to 7 mm. No signs of hydronephrosis or solid
enhancing mass. No hydroureter or ureteral lithiasis.

Stomach/Bowel: Stomach appears normal. The appendix is visualized
and appears normal. Distal colonic diverticulosis noted. Sign
scratch set there is wall thickening with surrounding pericolonic
inflammatory fat stranding involving the sigmoid colon compatible
with acute diverticulitis. The inflamed loop of sigmoid colon
involves the dome of bladder with loss of normal fat plane between
the sigmoid colon and bladder dome. Gas noted within the non
dependent portion of the urinary bladder. Wall thickening along the
dome of bladder measures up to 1.4 cm.

Vascular/Lymphatic: No significant vascular findings are present. No
enlarged abdominal or pelvic lymph nodes.

Reproductive: Prostate is unremarkable.

Other: No free fluid or focal fluid collections.

Musculoskeletal: No acute or significant osseous findings.
IMPRESSION: 1. Imaging findings compatible with acute sigmoid diverticulitis.
(Note: The appearance of the sigmoid colon may also be seen with
colonic neoplasm and correlation with colon cancer screening is
recommended following resolution of this acute episode.)
2. Suspect colovesical fistula. The inflamed loop of sigmoid colon
is inseparable from the dome of bladder with loss of normal fat
plane between the 2 structures. Gas noted within the non dependent
portion of the urinary bladder.
3. No discrete fluid collection to suggest drainable abscess.
4. Hepatic steatosis.

These results will be called to the ordering clinician or
representative by the Radiologist Assistant, and communication
documented in the PACS or [REDACTED].

## 2023-07-11 ENCOUNTER — Ambulatory Visit: Payer: BC Managed Care – PPO | Attending: Cardiology | Admitting: Cardiology

## 2023-07-11 ENCOUNTER — Encounter: Payer: Self-pay | Admitting: Cardiology

## 2023-07-11 VITALS — BP 136/88 | HR 88 | Ht 72.0 in | Wt 238.0 lb

## 2023-07-11 DIAGNOSIS — R030 Elevated blood-pressure reading, without diagnosis of hypertension: Secondary | ICD-10-CM | POA: Insufficient documentation

## 2023-07-11 DIAGNOSIS — I493 Ventricular premature depolarization: Secondary | ICD-10-CM

## 2023-07-11 MED ORDER — METOPROLOL TARTRATE 25 MG PO TABS
ORAL_TABLET | ORAL | 3 refills | Status: DC
Start: 2023-07-11 — End: 2024-05-01

## 2023-07-11 NOTE — Patient Instructions (Signed)
 Medication Instructions:  - No changes *If you need a refill on your cardiac medications before your next appointment, please call your pharmacy*  Lab Work: - None ordered  Testing/Procedures: - None ordered  Follow-Up: At [facility], you and your health needs are our priority.  As part of our continuing mission to provide you with exceptional heart care, we have created designated Provider Care Teams.  These Care Teams include your primary Cardiologist (physician) and Advanced Practice Providers (APPs -  Physician Assistants and Nurse Practitioners) who all work together to provide you with the care you need, when you need it.  Your next appointment:   1 year(s)  Provider:   You may see Alm Clay, MD or one of the following Advanced Practice Providers on your designated Care Team:   Lonni Meager, NP Bernardino Bring, PA-C Cadence Franchester, PA-C Tylene Lunch, NP Barnie Hila, NP

## 2023-07-11 NOTE — Progress Notes (Signed)
 Cardiology Office Note:  .   Date:  07/14/2023  ID:  Jeremy Owens, DOB 06/21/1976, MRN [id number] PCP: Wendee Lynwood HERO, NP  [facility] Providers Cardiologist:  Alm Clay, MD     Chief Complaint  Patient presents with   8 month follow up     Doing well.    Palpitations    Doing well.  Accidentally threw out his PRN metoprolol  prescription.    Patient Profile: .     Jeremy Owens Jeremy Owens is a 48 y.o. 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  He was referred to Cardiology following an [facility] Admission with chest pain/ palpitations.   Woke up around 3 AM feeling hot and flushed with some palpitations in his heart.  Had a cough.  Noted that he had a catch his breath.  He was able to fall back asleep again and had another episode a few hours later.  Denied any overt chest pain or dyspnea, leg swelling.  No PND orthopnea.  Drinks 3 beers daily.  No history of withdrawal.  Does also drink coffee but had not had any at night. =>  Noted to be hypertensive at 106/104 with heart rate of 125 bpm.   EKG shows sinus tachycardia with rate of 110 with occasional PVCs. Troponin, D-dimer normal.  BMP normal.  TSH normal.  Given a dose of metoprolol . Referral to cardiology    Jeremy Owens was seen on December 21, 2021 for posthospital evaluation he described having a black sensation in his chest with flushing and a little discomfort in his chest.  Really denied any true chest pressure just a fluttering sensation.  Had noted him off-and-on but had not had an episode since ER.  I recommended he continue the metoprolol  and find an 8-month follow-up.  Subjective  Discussed the use of AI scribe software for clinical note transcription with the patient, who gave verbal consent to proceed.  History of Present  Illness   The patient, last seen approximately 18 months ago, reports overall good health with no significant health issues since the last visit. He has a history of palpitations, which have been well controlled with minimal episodes. The patient engages in regular physical activity, including strenuous activities such as climbing steps and hiking, without experiencing any significant symptoms.  The patient reports no chest pain, pressure, or tightness, and denies any episodes of syncope or dizziness. There is no reported difficulty in breathing, either at rest or during exertion. The patient denies any nocturnal dyspnea or orthopnea.  Previously, the patient was prescribed metoprolol  as needed for palpitations, which he reports was effective in controlling the symptoms. However, the medication was inadvertently discarded and has not been taken for several months. Despite this, the patient has not experienced any significant increase in palpitations.  The patient denies any recent hospitalizations or illnesses and reports no edema in the legs. He has been monitoring his blood pressure, which has been slightly elevated on the day of the visit, potentially due to stress. The patient has not been on any standing medications and has not reported any new symptoms or health concerns.      ROS:  Review of Systems - Negative except symptoms in HPI    Objective   Studies Reviewed: SABRA   EKG Interpretation  Date/Time:  Thursday July 11 2023 08:47:18 EST Ventricular Rate:  88 PR Interval:  142 QRS Duration:  94 QT Interval:  376 QTC Calculation: 454 R Axis:   66  Text Interpretation: Sinus rhythm with occasional Premature ventricular complexes When compared with ECG of 10-Dec-2021 11:33, No significant change was found Confirmed by Anner Lenis ([id number]) on 07/11/2023 9:05:18 AM    No studies  Risk Assessment/Calculations:     Blood pressure slightly elevated today, but routinely more stable at  home.  Will monitor.      Physical Exam:   VS:  BP 136/88 (BP Location: Right Arm, Patient Position: Sitting, Cuff Size: Normal)   Pulse 88   Ht 6' (1.829 m)   Wt 238 lb (108 kg)   SpO2 98%   BMI 32.28 kg/m    Wt Readings from Last 3 Encounters:  07/11/23 238 lb (108 kg)  09/24/22 245 lb 9.6 oz (111.4 kg)  09/03/22 243 lb 8 oz (110.5 kg)    GEN: Well nourished, well groomed in no acute distress; mildly obese but otherwise healthy-appearing.   NECK: No JVD; No carotid bruits CARDIAC: RRR, Normal but distant S1, S2; no murmurs, rubs, gallops RESPIRATORY:  Clear to auscultation without rales, wheezing or rhonchi ; nonlabored, good air movement. ABDOMEN: Soft, non-tender, non-distended EXTREMITIES:  No edema; No deformity      ASSESSMENT AND PLAN: .    Problem List Items Addressed This Visit       Cardiology Problems   PVC (premature ventricular contraction) - Primary (Chronic)   Infrequent palpitations, no associated chest pain, syncope, or dyspnea. Tolerating physical activity well. Metoprolol  PRN effective in resolving palpitations. -Refill Metoprolol  PRN prescription.      Relevant Medications   metoprolol  tartrate (LOPRESSOR ) 25 MG tablet   Other Relevant Orders   EKG 12-Lead (Completed)     Other   Elevated blood pressure reading without diagnosis of hypertension (Chronic)   Elevated blood pressure noted during visit, possibly related to acute stress.  No history of end-organ damage or cardiovascular disease. -Monitor blood pressure at home prior to primary care visit. -Consider initiation of Metoprolol  25mg  at night if blood pressure remains consistently elevated.        Follow-up in 1 year or sooner if symptoms worsen. Return in about 1 year (around 07/10/2024) for 1 Yr Follow-up.    Signed, Lenis MICAEL Anner, MD, MS Lenis Anner, M.D., M.S. Interventional Cardiologist  [facility]  Pager # [phone] Phone # [phone] [address]  [address] [place], [place] [address]

## 2023-07-14 ENCOUNTER — Encounter: Payer: Self-pay | Admitting: Cardiology

## 2023-07-14 NOTE — Assessment & Plan Note (Signed)
 Infrequent palpitations, no associated chest pain, syncope, or dyspnea. Tolerating physical activity well. Metoprolol PRN effective in resolving palpitations. -Refill Metoprolol PRN prescription.

## 2023-07-14 NOTE — Assessment & Plan Note (Signed)
 Elevated blood pressure noted during visit, possibly related to acute stress.  No history of end-organ damage or cardiovascular disease. -Monitor blood pressure at home prior to primary care visit. -Consider initiation of Metoprolol  25mg  at night if blood pressure remains consistently elevated.

## 2023-09-05 ENCOUNTER — Encounter: Payer: BC Managed Care – PPO | Admitting: Nurse Practitioner

## 2023-09-18 ENCOUNTER — Encounter: Payer: Self-pay | Admitting: Nurse Practitioner

## 2023-09-18 ENCOUNTER — Ambulatory Visit (INDEPENDENT_AMBULATORY_CARE_PROVIDER_SITE_OTHER): Payer: BC Managed Care – PPO | Admitting: Nurse Practitioner

## 2023-09-18 VITALS — BP 126/88 | HR 86 | Temp 98.3°F | Ht 72.0 in | Wt 238.0 lb

## 2023-09-18 DIAGNOSIS — Z13220 Encounter for screening for lipoid disorders: Secondary | ICD-10-CM

## 2023-09-18 DIAGNOSIS — Z131 Encounter for screening for diabetes mellitus: Secondary | ICD-10-CM | POA: Diagnosis not present

## 2023-09-18 DIAGNOSIS — E669 Obesity, unspecified: Secondary | ICD-10-CM

## 2023-09-18 DIAGNOSIS — Z1159 Encounter for screening for other viral diseases: Secondary | ICD-10-CM

## 2023-09-18 DIAGNOSIS — H7293 Unspecified perforation of tympanic membrane, bilateral: Secondary | ICD-10-CM | POA: Diagnosis not present

## 2023-09-18 DIAGNOSIS — I493 Ventricular premature depolarization: Secondary | ICD-10-CM

## 2023-09-18 DIAGNOSIS — Z0000 Encounter for general adult medical examination without abnormal findings: Secondary | ICD-10-CM

## 2023-09-18 DIAGNOSIS — Z6832 Body mass index (BMI) 32.0-32.9, adult: Secondary | ICD-10-CM

## 2023-09-18 LAB — CBC
HCT: 46.8 % (ref 39.0–52.0)
Hemoglobin: 16.2 g/dL (ref 13.0–17.0)
MCHC: 34.5 g/dL (ref 30.0–36.0)
MCV: 91.8 fl (ref 78.0–100.0)
Platelets: 219 10*3/uL (ref 150.0–400.0)
RBC: 5.1 Mil/uL (ref 4.22–5.81)
RDW: 14.2 % (ref 11.5–15.5)
WBC: 8 10*3/uL (ref 4.0–10.5)

## 2023-09-18 LAB — LIPID PANEL
Cholesterol: 190 mg/dL (ref 0–200)
HDL: 48.6 mg/dL (ref >39.00)
LDL Cholesterol: 105 mg/dL — ABNORMAL HIGH (ref 0–99)
NonHDL: 141.13
Total CHOL/HDL Ratio: 4
Triglycerides: 180 mg/dL — ABNORMAL HIGH (ref 0.0–149.0)
VLDL: 36 mg/dL (ref 0.0–40.0)

## 2023-09-18 LAB — TSH: TSH: 1.72 u[IU]/mL (ref 0.35–5.50)

## 2023-09-18 LAB — COMPREHENSIVE METABOLIC PANEL
ALT: 41 U/L (ref 0–53)
AST: 26 U/L (ref 0–37)
Albumin: 4.4 g/dL (ref 3.5–5.2)
Alkaline Phosphatase: 64 U/L (ref 39–117)
BUN: 10 mg/dL (ref 6–23)
CO2: 27 meq/L (ref 19–32)
Calcium: 9.3 mg/dL (ref 8.4–10.5)
Chloride: 101 meq/L (ref 96–112)
Creatinine, Ser: 1.03 mg/dL (ref 0.40–1.50)
GFR: 87.39 mL/min (ref >60.00)
Glucose, Bld: 78 mg/dL (ref 70–99)
Potassium: 4 meq/L (ref 3.5–5.1)
Sodium: 136 meq/L (ref 135–145)
Total Bilirubin: 0.5 mg/dL (ref 0.2–1.2)
Total Protein: 7.6 g/dL (ref 6.0–8.3)

## 2023-09-18 LAB — HEMOGLOBIN A1C: Hgb A1c MFr Bld: 5.6 % (ref 4.6–6.5)

## 2023-09-18 NOTE — Patient Instructions (Signed)
 Nice to see you today I will be in touch with the labs once I have them Follow up with me in 1 year. Sooner if you need me

## 2023-09-18 NOTE — Assessment & Plan Note (Signed)
 Discussed age-appropriate immunizations and screening exams.  Did review patient's personal, surgical, social, family histories.  Patient is up-to-date on all age-appropriate vaccinations he would like.  Patient refused flu vaccine today.  Patient is up-to-date on CRC screening.  He is too young for PSA or prostate cancer screening.  Patient was given information at discharge about preventative healthcare maintenance with anticipatory guidance.

## 2023-09-18 NOTE — Assessment & Plan Note (Signed)
 Pending TSH, A1c, lipid panel.  Continue working healthy lifestyle modifications.  Patient has lost a couple pounds since last year

## 2023-09-18 NOTE — Progress Notes (Signed)
 Established Patient Office Visit  Subjective   Patient ID: Jeremy Owens, male    DOB: 1975/12/22  Age: 48 y.o. MRN: [id number]  Chief Complaint  Patient presents with   Annual Exam    HPI  for complete physical and follow up of chronic conditions.   PVC: followed by cardiology and is on metoprolol 12.5mg  daily   OSA: followed by pulmonology and is on oral appliance   Immunizations: -Tetanus: Completed in 2017 -Influenza: refused -Shingles: too young  -Pneumonia: too young    Diet: Fair diet. 2 meals a day and coffee int he am. Sweet tea and water. He does snack  Exercise: No regular exercise. He goes to the gym twcie a week. He does weights and cardio and will do 60 mins. Does hike on the weekends   Eye exam: Prn  Dental exam: Completes semi-annually    Colonoscopy: Completed in 03/02/2021, repeat in 5 years Lung Cancer Screening: NA  PSA: too young   Sleep: goes to bed around 10 and get up around 6. Does feels rested. Does snore       Review of Systems  Constitutional:  Negative for chills and fever.  Respiratory:  Negative for shortness of breath.   Cardiovascular:  Negative for chest pain and leg swelling.  Gastrointestinal:  Negative for abdominal pain, blood in stool, constipation, diarrhea, nausea and vomiting.       BM daily   Genitourinary:  Negative for dysuria and hematuria.  Neurological:  Negative for tingling and headaches.  Psychiatric/Behavioral:  Negative for hallucinations and suicidal ideas.       Objective:     BP 126/88   Pulse 86   Temp 98.3 F (36.8 C) (Oral)   Ht 6' (1.829 m)   Wt 238 lb (108 kg)   SpO2 93%   BMI 32.28 kg/m  BP Readings from Last 3 Encounters:  09/18/23 126/88  07/11/23 136/88  09/24/22 120/80   Wt Readings from Last 3 Encounters:  09/18/23 238 lb (108 kg)  07/11/23 238 lb (108 kg)  09/24/22 245 lb 9.6 oz (111.4 kg)   SpO2 Readings from Last 3 Encounters:  09/18/23 93%  07/11/23 98%  09/24/22  98%      Physical Exam Vitals and nursing note reviewed.  Constitutional:      Appearance: Normal appearance.  HENT:     Right Ear: Ear canal and external ear normal.     Left Ear: Ear canal and external ear normal.     Ears:      Mouth/Throat:     Mouth: Mucous membranes are moist.     Pharynx: Oropharynx is clear.  Eyes:     Extraocular Movements: Extraocular movements intact.     Pupils: Pupils are equal, round, and reactive to light.  Cardiovascular:     Rate and Rhythm: Normal rate and regular rhythm.     Pulses: Normal pulses.     Heart sounds: Normal heart sounds.  Pulmonary:     Effort: Pulmonary effort is normal.     Breath sounds: Normal breath sounds.  Abdominal:     General: Bowel sounds are normal. There is no distension.     Palpations: There is no mass.     Tenderness: There is no abdominal tenderness.     Hernia: No hernia is present.  Musculoskeletal:     Right lower leg: No edema.     Left lower leg: No edema.  Lymphadenopathy:  Cervical: No cervical adenopathy.  Skin:    General: Skin is warm.  Neurological:     General: No focal deficit present.     Mental Status: He is alert.     Deep Tendon Reflexes:     Reflex Scores:      Bicep reflexes are 2+ on the right side and 2+ on the left side.      Patellar reflexes are 2+ on the right side and 2+ on the left side.    Comments: Bilateral upper and lower extremity strength 5/5  Psychiatric:        Mood and Affect: Mood normal.        Behavior: Behavior normal.        Thought Content: Thought content normal.        Judgment: Judgment normal.      No results found for any visits on 09/18/23.    The 10-year ASCVD risk score (Arnett DK, et al., 2019) is: 2.3%    Assessment & Plan:   Problem List Items Addressed This Visit       Cardiovascular and Mediastinum   PVC (premature ventricular contraction) (Chronic)   Patient currently followed by cardiology, Bryan Lemma, MD.  Continue  metoprolol 12.5 mg daily        Nervous and Auditory   Perforation of both tympanic membranes   Patient states he felt both the ears pop and then had tinnitus in the left lung.  Query healing tympanic membrane ruptures.  Ambulatory referral to ENT patient was informed not to submerge his head or putting anything in his ears until seen by ENT      Relevant Orders   Ambulatory referral to ENT     Other   Preventative health care - Primary   Discussed age-appropriate immunizations and screening exams.  Did review patient's personal, surgical, social, family histories.  Patient is up-to-date on all age-appropriate vaccinations he would like.  Patient refused flu vaccine today.  Patient is up-to-date on CRC screening.  He is too young for PSA or prostate cancer screening.  Patient was given information at discharge about preventative healthcare maintenance with anticipatory guidance.      Relevant Orders   Comprehensive metabolic panel   TSH   CBC   Obesity (BMI 30-39.9)   Pending TSH, A1c, lipid panel.  Continue working healthy lifestyle modifications.  Patient has lost a couple pounds since last year      Relevant Orders   Comprehensive metabolic panel   CBC   Other Visit Diagnoses       Encounter for hepatitis C screening test for low risk patient       Relevant Orders   Hepatitis C Antibody     Screening for diabetes mellitus       Relevant Orders   Hemoglobin A1c     Screening for lipid disorders       Relevant Orders   Lipid panel       Return in about 1 year (around 09/17/2024) for CPE and Labs.    Audria Nine, NP

## 2023-09-18 NOTE — Assessment & Plan Note (Signed)
 Patient currently followed by cardiology, Bryan Lemma, MD.  Continue metoprolol 12.5 mg daily

## 2023-09-18 NOTE — Assessment & Plan Note (Signed)
 Patient states he felt both the ears pop and then had tinnitus in the left lung.  Query healing tympanic membrane ruptures.  Ambulatory referral to ENT patient was informed not to submerge his head or putting anything in his ears until seen by ENT

## 2023-09-19 ENCOUNTER — Encounter: Payer: Self-pay | Admitting: Nurse Practitioner

## 2023-09-19 LAB — HEPATITIS C ANTIBODY: Hepatitis C Ab: NONREACTIVE

## 2023-10-08 DIAGNOSIS — H6983 Other specified disorders of Eustachian tube, bilateral: Secondary | ICD-10-CM | POA: Diagnosis not present

## 2023-12-12 ENCOUNTER — Encounter: Payer: Self-pay | Admitting: Primary Care

## 2023-12-12 ENCOUNTER — Ambulatory Visit: Admitting: Primary Care

## 2023-12-12 VITALS — BP 140/78 | HR 82 | Ht 72.0 in | Wt 241.0 lb

## 2023-12-12 DIAGNOSIS — G4733 Obstructive sleep apnea (adult) (pediatric): Secondary | ICD-10-CM | POA: Diagnosis not present

## 2023-12-12 DIAGNOSIS — Z6832 Body mass index (BMI) 32.0-32.9, adult: Secondary | ICD-10-CM | POA: Diagnosis not present

## 2023-12-12 DIAGNOSIS — E669 Obesity, unspecified: Secondary | ICD-10-CM

## 2023-12-12 NOTE — Progress Notes (Signed)
 @Patient  ID: Jeremy Owens, male    DOB: 12/07/1975, 48 y.o.   MRN: [id number]  Chief Complaint  Patient presents with   Follow-up    Wearing oral appliance bought online, working okay    Referring provider: Dorothe Gaster, NP  HPI: 48 year, never smoked. PMH obesity, snoring, PVC, s/p lap colectomy, hx right knee arthoplasty.   Previous [facility] pulmonary encounter: 09/24/2022- Sleep consult  Patient presents today for sleep consult. He has symptoms of loud snoring and witnessed apnea. His wife has witnessed him stop breathing and irregular breathing pattern. She has told him that he snores loudly. 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 Occasionally will pick up a night shift but does not consistently. He is not particularly impacted by his sleep quality. No significant daytime sleepiness. No issues falling or staying asleep. He wakes up around 3am to use the restroom. He sustained knee injury September 2010. Knee pain can wake him up or cause him to need to move/change position at night. He had nerve ablation done in August 2023.  He sleeps with body pillow. He sleeps on his side primary. Hx partial knee replacement. He has put weight on the last year. He had lap colectomy in 2022 and since then has not been making good food choices.  He knows he can do better with this. Denies symptoms of narcolepsy, cataplexy or sleep walking.   Sleep questionnaire:  Symptoms- loud snoring, witnessed apnea     Prior sleep study- none  Bedtime-10-11pm Time to fall asleep- not long  Nocturnal awakenings- 1-2 times  Out of bed/start of day- 5:30-6 am Weight changes- up 30 lbs Do you operate heavy machinery- no Do you currently wear CPAP- no Do you current wear oxygen- no Epworth- 3  Social hx: Patient is married.  He lives with his wife and kids.  He works as a Emergency planning/management officer.  No recent travel in the last 3 months.  He quit drinking alcohol .  No history of tobacco abuse.    10/26/2022 Patient  contacted today to review sleep study results. He has been told by his wife that he snores loudly and has witnessed apneas. He feels he sleeps alright. He is no more tired than most people. HST 10/02/22 showed moderate OSA, AHI 15/hour.  We reviewed sleep study results and treatment options.  Since patient is mildly impacted by his sleep recommending patient be considered for oral appliance to help with snoring and mild to moderate apneas.  He is in agreement with plan.  Encourage side sleeping position and weight loss   12/12/2023 -Interim hx  Discussed the use of AI scribe software for clinical note transcription with the patient, who gave verbal consent to proceed.  History of Present Illness   Jeremy Owens is a 48 year old male with moderate sleep apnea who presents for a follow-up visit.  He has previously tried various oral appliances for his sleep apnea, which he found to be somewhat effective. However, he is interested in exploring a more formal and lasting solution. His sleep study from April of the previous year indicated moderate sleep apnea with 15.4 apneic events per hour, and the lowest oxygen saturation recorded was 78%, with an average of 93%. He experiences some sleep disruption, though he attributes a recent poor night's sleep to external disturbances rather than his sleep apnea.  He is attempting to lose weight, although he describes the process as challenging. He is engaging in more  physical activity, including hiking and regular gym visits. His BMI is over 30, and he acknowledges the potential benefits of weight loss on his sleep apnea.  His wife is on Zepbound and has lost about 60 pounds with it. He is aware of the potential side effects of weight loss medications, including nausea, vomiting, diarrhea, constipation, and risks related to thyroid  cancer, gallbladder issues, and mental health changes.      No Known Allergies  Immunization History  Administered Date(s)  Administered   Tdap 09/11/2015    Past Medical History:  Diagnosis Date   COVID-19 2022   Diverticulitis    Family history of polyps in the colon    History of kidney stones    History of nephrolithiasis     Tobacco History: Social History   Tobacco Use  Smoking Status Never  Smokeless Tobacco Former   Types: Chew   Quit date: 12/2020   Counseling given: Not Answered   Outpatient Medications Prior to Visit  Medication Sig Dispense Refill   metoprolol  tartrate (LOPRESSOR ) 25 MG tablet Take 0.5 tablet (12.5 mg) by mouth twice daily as needed for PVC's 30 tablet 3   No facility-administered medications prior to visit.      Review of Systems  Review of Systems  Constitutional: Negative.   HENT: Negative.    Respiratory: Negative.    Psychiatric/Behavioral:  Positive for sleep disturbance.      Physical Exam  BP (!) 140/78 (BP Location: Left Arm, Patient Position: Sitting, Cuff Size: Large)   Pulse 82   Ht 6' (1.829 m)   Wt 241 lb (109.3 kg)   SpO2 98%   BMI 32.69 kg/m  Physical Exam Constitutional:      General: He is not in acute distress.    Appearance: Normal appearance. He is not ill-appearing.  HENT:     Head: Normocephalic and atraumatic.     Mouth/Throat:     Mouth: Mucous membranes are moist.     Pharynx: Oropharynx is clear.   Cardiovascular:     Rate and Rhythm: Normal rate and regular rhythm.  Pulmonary:     Effort: Pulmonary effort is normal.     Breath sounds: Normal breath sounds.   Musculoskeletal:        General: Normal range of motion.   Skin:    General: Skin is warm and dry.   Neurological:     General: No focal deficit present.     Mental Status: He is alert and oriented to person, place, and time. Mental status is at baseline.   Psychiatric:        Mood and Affect: Mood normal.        Behavior: Behavior normal.        Thought Content: Thought content normal.        Judgment: Judgment normal.      Lab  Results:  CBC    Component Value Date/Time   WBC 8.0 09/18/2023 1115   RBC 5.10 09/18/2023 1115   HGB 16.2 09/18/2023 1115   HGB 16.8 02/02/2021 0854   HCT 46.8 09/18/2023 1115   HCT 49.7 02/02/2021 0854   PLT 219.0 09/18/2023 1115   PLT 224 02/02/2021 0854   MCV 91.8 09/18/2023 1115   MCV 93 02/02/2021 0854   MCH 31.0 12/10/2021 1138   MCHC 34.5 09/18/2023 1115   RDW 14.2 09/18/2023 1115   RDW 12.6 02/02/2021 0854   LYMPHSABS 0.9 03/04/2021 2017   LYMPHSABS 1.3 02/02/2021 0854  MONOABS 1.0 03/04/2021 2017   EOSABS 0.0 03/04/2021 2017   EOSABS 0.1 02/02/2021 0854   BASOSABS 0.0 03/04/2021 2017   BASOSABS 0.0 02/02/2021 0854    BMET    Component Value Date/Time   NA 136 09/18/2023 1115   NA 138 02/02/2021 0854   K 4.0 09/18/2023 1115   CL 101 09/18/2023 1115   CO2 27 09/18/2023 1115   GLUCOSE 78 09/18/2023 1115   BUN 10 09/18/2023 1115   BUN 10 02/02/2021 0854   CREATININE 1.03 09/18/2023 1115   CALCIUM 9.3 09/18/2023 1115   GFRNONAA >60 12/10/2021 1138   GFRAA >60 10/19/2017 0817    BNP No results found for: BNP  ProBNP No results found for: PROBNP  Imaging: No results found.   Assessment & Plan:   1. Moderate obstructive sleep apnea (Primary) - Ambulatory referral to Orthodontics  2. Obesity (BMI 30-39.9)  Assessment and Plan    Moderate sleep apnea Moderate sleep apnea diagnosed with a sleep study in April 2024, showing 15.4 apneic events per hour, with the lowest oxygen saturation at 78% and an average of 93%. The condition is borderline mild to moderate severity. He has experienced some benefit from oral appliances but desires a more formal solution. Risks of untreated sleep apnea include increased risk for cardiac arrhythmias, stroke, diabetes, and pulmonary hypertension. He is mildly symptomatic and has been advised on weight loss and side sleeping positions as part of the management plan. - Provide contact information for Dr. Abel Abelson and  Dr. Ardell Koller for formal oral appliance consultation. - Discuss with insurance regarding coverage for Zepbound as a potential treatment option for weight loss and sleep apnea. - Follow up with primary care physician regarding Zepbound prescription.  Obesity Obesity with a BMI over 30. He is actively trying to lose weight through increased physical activity, including hiking and gym workouts. Zepbound is discussed as a potential treatment option for weight loss, which is covered by his insurance. He is informed about the side effects of Zepbound, including nausea, vomiting, diarrhea, constipation, risk of thyroid  cancer, gallbladder issues, pancreatitis, allergic reactions, hypoglycemia, and mental health changes. The risk of thyroid  cancer is based on animal studies, and it is not known if it applies to humans. He is advised to consult with the primary care physician regarding the prescription of Zepbound. - Consult with primary care physician about Zepbound prescription. - Consider Zepbound for weight loss if approved by primary care physician and insurance.    Antonio Baumgarten, NP 12/12/2023

## 2023-12-12 NOTE — Patient Instructions (Addendum)
-  MODERATE SLEEP APNEA: Moderate sleep apnea is a condition where you experience frequent interruptions in breathing during sleep, which can lead to serious health issues if untreated. We discussed the benefits of weight loss and side sleeping positions. You will be provided with contact information for Dr. Abel Abelson and Dr. Ardell Koller for a formal oral appliance consultation. Additionally, you should discuss with your insurance about coverage for Zepbound as a potential treatment option for weight loss and sleep apnea. Follow up with your primary care physician regarding a Zepbound prescription.  -OBESITY: Obesity is a condition where you have an excessive amount of body fat, which can impact your overall health. You are actively trying to lose weight through increased physical activity. We discussed Zepbound as a potential treatment option for weight loss, and you were informed about its possible side effects. You should consult with your primary care physician about the prescription of Zepbound and consider it if approved by your physician and insurance.  INSTRUCTIONS: Please follow up with your primary care physician regarding the prescription of Zepbound. Additionally, contact Dr. Abel Abelson and Dr. Ardell Koller for a formal oral appliance consultation. Discuss with your insurance about coverage for Zepbound as a potential treatment option for weight loss and sleep apnea.  Follow-up 6 months with Jeremy Owens/ potentially sooner if starting zepbound with our office

## 2023-12-17 ENCOUNTER — Ambulatory Visit: Payer: Self-pay | Admitting: Oncology

## 2023-12-17 ENCOUNTER — Encounter: Payer: Self-pay | Admitting: Oncology

## 2023-12-17 ENCOUNTER — Other Ambulatory Visit: Payer: Self-pay

## 2023-12-17 VITALS — BP 146/98 | HR 100 | Ht 72.0 in | Wt 240.0 lb

## 2023-12-17 DIAGNOSIS — M25522 Pain in left elbow: Secondary | ICD-10-CM

## 2023-12-17 NOTE — Progress Notes (Signed)
 [facility]  [address] [place], [place] [address] Phone: [phone] Fax: [phone]   Office Visit Note  Patient Name: Jeremy Owens  Date of UXNAT:[id number]  Med Rec number [id number]  Date of Service: 12/17/2023  Patient has no known allergies.  Chief Complaint  Patient presents with   Elbow Pain    Patient c/o L elbow pain x 2 months. Symptoms have been getting progressively worse over time. He has tried ibuprofen  and Tylenol  which only offer short-term relief. He states it is becoming difficult to lift objects.   HPI Patient is an 48 y.o. here for left elbow pain intermittently over the past few years but more consistently here recently.  Reports yesterday and today he finds it hard to put his phone to his ear.  Pain is on the outer part of his left elbow.  No injuries that he is aware of.  Has noticed a little bit of swelling to his left elbow.  Reports he was carrying groceries in yesterday and noted some discomfort when lifting the bags.  States he has not taken anything over-the-counter for his symptoms.  He had 13 surgeries to his right knee due to an injury on the job and does not take anything over-the-counter if he does not have to.  Reports he does not do well with prednisone.    Current Medication:  Outpatient Encounter Medications as of 12/17/2023  Medication Sig Note   metoprolol  tartrate (LOPRESSOR ) 25 MG tablet Take 0.5 tablet (12.5 mg) by mouth twice daily as needed for PVC's 09/18/2023: PRN   No facility-administered encounter medications on file as of 12/17/2023.      Medical History: Past Medical History:  Diagnosis Date   COVID-19 2022   Diverticulitis    Family history of polyps in the colon    History of kidney stones    History of nephrolithiasis      Vital Signs: BP (!) 146/98   Pulse 100   Ht 6' (1.829 m)   Wt 240 lb (108.9 kg)   SpO2 96%   BMI 32.55 kg/m   ROS: As per HPI.  All other pertinent ROS negative.      Review of Systems  Constitutional:  Negative for fatigue.  Musculoskeletal:  Positive for arthralgias and joint swelling.    Physical Exam  Musculoskeletal:     Left elbow: Swelling present. No effusion. Normal range of motion. Tenderness present in medial epicondyle and lateral epicondyle.     No results found for this or any previous visit (from the past 24 hours).  Assessment/Plan: 1. Left elbow pain (Primary) Exam and symptoms consistent with tendinitis of left elbow.  We discussed ibuprofen  6 to 800 mg in the morning and at bedtime for the next 5 to 7 days along with a elbow brace that we will order and have him pick up tomorrow.  Recommend ice and resting as able.  If no improvement, would recommend referral to orthopedics for possible steroid injections.  General Counseling: pranay hilbun understanding of the findings of todays visit and agrees with plan of treatment. I have discussed any further diagnostic evaluation that may be needed or ordered today. We also reviewed his medications today. he has been encouraged to call the office with any questions or concerns that should arise related to todays visit.   No orders of the defined types were placed in this encounter.   No orders of the defined types were placed in this encounter.  I spent 20 minutes dedicated to the care of this patient (face-to-face and non-face-to-face) on the date of the encounter to include what is described in the assessment and plan.   Charlton Cooler, NP 12/17/2023 2:48 PM

## 2023-12-25 NOTE — Telephone Encounter (Signed)
**Note De-identified  [place] Obfuscation** Please advise 

## 2023-12-25 NOTE — Telephone Encounter (Signed)
 Yes we should be able to prescribe it Schedule virtual visit to discuss medication Did he check with insurance about coverage?

## 2024-03-10 ENCOUNTER — Encounter: Payer: Self-pay | Admitting: Primary Care

## 2024-03-10 ENCOUNTER — Other Ambulatory Visit: Payer: Self-pay | Admitting: Primary Care

## 2024-03-10 ENCOUNTER — Telehealth: Payer: Self-pay | Admitting: Primary Care

## 2024-03-10 ENCOUNTER — Ambulatory Visit (INDEPENDENT_AMBULATORY_CARE_PROVIDER_SITE_OTHER): Admitting: Primary Care

## 2024-03-10 VITALS — BP 136/74 | HR 73 | Temp 97.6°F | Ht 72.0 in | Wt 241.4 lb

## 2024-03-10 DIAGNOSIS — G4733 Obstructive sleep apnea (adult) (pediatric): Secondary | ICD-10-CM

## 2024-03-10 MED ORDER — ZEPBOUND 2.5 MG/0.5ML ~~LOC~~ SOAJ: 2.5000 mg | SUBCUTANEOUS | 1 refills | Status: DC

## 2024-03-10 NOTE — Telephone Encounter (Signed)
**Note De-identified  [place] Obfuscation** Please advise 

## 2024-03-10 NOTE — Patient Instructions (Signed)
  VISIT SUMMARY: Today, you came in to discuss starting Zepbound , a medication that can help with weight loss and potentially improve your sleep apnea. We also talked about the delay in receiving your mouthpiece and alternative ways to manage your sleep apnea in the meantime.  YOUR PLAN: -MODERATE OBSTRUCTIVE SLEEP APNEA: Moderate obstructive sleep apnea is a condition where your airway becomes partially blocked during sleep, causing breathing interruptions. We discussed starting Zepbound  at 2.5 mg once weekly to help with weight loss, which may reduce your apnea episodes. You should also try to sleep on your side and avoid sleeping on your back. We will follow up in four to six weeks to see how you are doing.  -OBESITY (BMI 32): Obesity is a condition where excess body fat has accumulated to the extent that it may have a negative effect on your health. Your BMI is 32, which classifies you as obese. We will start you on Zepbound  at 2.5 mg once weekly, with the possibility of increasing the dose to 10-15 mg. Be aware of potential side effects like nausea, vomiting, diarrhea, and constipation, and follow the dietary recommendations provided to help manage these. Make sure to stay hydrated and avoid drinking large amounts of fluid during meals. We will also work on getting prior authorization from your insurance for this medication.  INSTRUCTIONS: Please start taking Zepbound  at 2.5 mg once weekly. We will provide one refill if you need to adjust the dose after four weeks. Follow the dietary recommendations to manage potential side effects. Try to sleep on your side to help with your sleep apnea. We will follow up in four to six weeks, either in person or virtually, to check on your progress. Make sure to have your vital signs available for the follow-up appointment.  Rx: Zepbound  2.5mg  [place] once weekly (one refill) ; plan taper up doses as tolerates   Follow-up 6 weeks virtual follow-up with Landry NP

## 2024-03-10 NOTE — Telephone Encounter (Signed)
 Patient is being starting on zepbound  for moderate sleep apnea, needs prior auth through his insurance   Patient has moderate to severe sleep apnea related to obesity with BMI 32, posing significant cardiovascular risks. Patient has failed traditional weight loss measures with diet and exercise for >/6 months. Patient will be initiated on Zepbound  (tirzepatide ) for weight management. Zepbound  is the only pharmaceutical treatment approved for moderate-to-severe OSA in adults who are overweight (BMI >/27) or obese (BMI >/30). The patient will continue lifestyle modifications, including structured nutrition and physical activity as directed. No other GLP1 therapy will be used simultaneously at this time. The patient does not have any FDA labeled contraindications to this agent, including pregnancy, lactation, hx or family history of medullary thyroid  cancer, or multiple endocrine neoplasia type II. Side effect profile has been reviewed with patient. Aware of red flag symptoms to notify of immediately or seek emergency care, including severe nausea/vomiting, inability to pass bowels or gas, severe abdominal pain/tenderness, jaundice.

## 2024-03-10 NOTE — Progress Notes (Signed)
 @Patient  ID: Jeremy Owens, male    DOB: May 13, 1976, 48 y.o.   MRN: [id number]  Chief Complaint  Patient presents with   Medical Management of Chronic Issues    Pt wanted to discuss getting on Zepbound      Referring provider: Wendee Lynwood HERO, NP  HPI: 57 year, never smoked. PMH obesity, snoring, PVC, s/p lap colectomy, hx right knee arthoplasty.   Previous [facility] pulmonary encounter: 09/24/2022- Sleep consult  Patient presents today for sleep consult. He has symptoms of loud snoring and witnessed apnea. His wife has witnessed him stop breathing and irregular breathing pattern. She has told him that he snores loudly. 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 Occasionally will pick up a night shift but does not consistently. He is not particularly impacted by his sleep quality. No significant daytime sleepiness. No issues falling or staying asleep. He wakes up around 3am to use the restroom. He sustained knee injury September 2010. Knee pain can wake him up or cause him to need to move/change position at night. He had nerve ablation done in August 2023.  He sleeps with body pillow. He sleeps on his side primary. Hx partial knee replacement. He has put weight on the last year. He had lap colectomy in 2022 and since then has not been making good food choices.  He knows he can do better with this. Denies symptoms of narcolepsy, cataplexy or sleep walking.   Sleep questionnaire:  Symptoms- loud snoring, witnessed apnea     Prior sleep study- none  Bedtime-10-11pm Time to fall asleep- not long  Nocturnal awakenings- 1-2 times  Out of bed/start of day- 5:30-6 am Weight changes- up 30 lbs Do you operate heavy machinery- no Do you currently wear CPAP- no Do you current wear oxygen- no Epworth- 3  Social hx: Patient is married.  He lives with his wife and kids.  He works as a Emergency planning/management officer.  No recent travel in the last 3 months.  He quit drinking alcohol .  No history of tobacco abuse.     10/26/2022 Patient contacted today to review sleep study results. He has been told by his wife that he snores loudly and has witnessed apneas. He feels he sleeps alright. He is no more tired than most people. HST 10/02/22 showed moderate OSA, AHI 15/hour.  We reviewed sleep study results and treatment options.  Since patient is mildly impacted by his sleep recommending patient be considered for oral appliance to help with snoring and mild to moderate apneas.  He is in agreement with plan.  Encourage side sleeping position and weight loss   12/12/2023  Discussed the use of AI scribe software for clinical note transcription with the patient, who gave verbal consent to proceed.  History of Present Illness   Jeremy Owens is a 48 year old male with moderate sleep apnea who presents for a follow-up visit.  He has previously tried various oral appliances for his sleep apnea, which he found to be somewhat effective. However, he is interested in exploring a more formal and lasting solution. His sleep study from April of the previous year indicated moderate sleep apnea with 15.4 apneic events per hour, and the lowest oxygen saturation recorded was 78%, with an average of 93%. He experiences some sleep disruption, though he attributes a recent poor night's sleep to external disturbances rather than his sleep apnea.  He is attempting to lose weight, although he describes the process as challenging. He is  engaging in more physical activity, including hiking and regular gym visits. His BMI is over 30, and he acknowledges the potential benefits of weight loss on his sleep apnea.  His wife is on Zepbound  and has lost about 60 pounds with it. He is aware of the potential side effects of weight loss medications, including nausea, vomiting, diarrhea, constipation, and risks related to thyroid  cancer, gallbladder issues, and mental health changes.      03/10/2024- Interim hx  Discussed the use of AI scribe  software for clinical note transcription with the patient, who gave verbal consent to proceed.  History of Present Illness Jeremy Owens is a 48 year old male who presents for a consultation regarding Zantbound.  Patient has moderate OSA, previously discussed treatment options including weight loss, oral appliance, CPAP therapy and referral to ENT. He preferred to work on weight loss and has been referred to orthodontics for oral appliance for treatment of OSA. He is in the processes of getting a mouthpiece with Dr. Micky which he has paid for it. He is advised to try side sleeping positions to help with his sleep apnea management.  He is considering starting Zepbound , a medication that requires prior authorization from his insurance. He is aware of potential side effects such as nausea, vomiting, diarrhea, and constipation, and has been informed about dietary modifications to mitigate these effects, including eating smaller, more frequent meals and avoiding certain foods. No personal or family history of diabetes or thyroid  cancer.   No Known Allergies  Immunization History  Administered Date(s) Administered   Tdap 09/11/2015    Past Medical History:  Diagnosis Date   COVID-19 2022   Diverticulitis    Family history of polyps in the colon    History of kidney stones    History of nephrolithiasis     Tobacco History: Social History   Tobacco Use  Smoking Status Never  Smokeless Tobacco Former   Types: Chew   Quit date: 12/2020   Counseling given: Not Answered   Outpatient Medications Prior to Visit  Medication Sig Dispense Refill   metoprolol  tartrate (LOPRESSOR ) 25 MG tablet Take 0.5 tablet (12.5 mg) by mouth twice daily as needed for PVC's 30 tablet 3   No facility-administered medications prior to visit.   Review of Systems  Review of Systems  Constitutional: Negative.   Respiratory: Negative.      Physical Exam  BP 136/74   Pulse 73   Temp 97.6 F  (36.4 C)   Ht 6' (1.829 m)   Wt 241 lb 6.4 oz (109.5 kg)   SpO2 96% Comment: RA  BMI 32.74 kg/m  Physical Exam Constitutional:      Appearance: Normal appearance.  HENT:     Head: Normocephalic and atraumatic.  Cardiovascular:     Rate and Rhythm: Normal rate and regular rhythm.  Pulmonary:     Effort: Pulmonary effort is normal.     Breath sounds: Normal breath sounds.  Skin:    General: Skin is warm and dry.  Neurological:     General: No focal deficit present.     Mental Status: He is alert and oriented to person, place, and time. Mental status is at baseline.  Psychiatric:        Mood and Affect: Mood normal.        Behavior: Behavior normal.        Thought Content: Thought content normal.        Judgment: Judgment normal.  Lab Results:  CBC    Component Value Date/Time   WBC 8.0 09/18/2023 1115   RBC 5.10 09/18/2023 1115   HGB 16.2 09/18/2023 1115   HGB 16.8 02/02/2021 0854   HCT 46.8 09/18/2023 1115   HCT 49.7 02/02/2021 0854   PLT 219.0 09/18/2023 1115   PLT 224 02/02/2021 0854   MCV 91.8 09/18/2023 1115   MCV 93 02/02/2021 0854   MCH 31.0 12/10/2021 1138   MCHC 34.5 09/18/2023 1115   RDW 14.2 09/18/2023 1115   RDW 12.6 02/02/2021 0854   LYMPHSABS 0.9 03/04/2021 2017   LYMPHSABS 1.3 02/02/2021 0854   MONOABS 1.0 03/04/2021 2017   EOSABS 0.0 03/04/2021 2017   EOSABS 0.1 02/02/2021 0854   BASOSABS 0.0 03/04/2021 2017   BASOSABS 0.0 02/02/2021 0854    BMET    Component Value Date/Time   NA 136 09/18/2023 1115   NA 138 02/02/2021 0854   K 4.0 09/18/2023 1115   CL 101 09/18/2023 1115   CO2 27 09/18/2023 1115   GLUCOSE 78 09/18/2023 1115   BUN 10 09/18/2023 1115   BUN 10 02/02/2021 0854   CREATININE 1.03 09/18/2023 1115   CALCIUM 9.3 09/18/2023 1115   GFRNONAA >60 12/10/2021 1138   GFRAA >60 10/19/2017 0817    BNP No results found for: BNP  ProBNP No results found for: PROBNP  Imaging: No results found.   Assessment & Plan:    1. OSA (obstructive sleep apnea) (Primary)  Assessment and Plan Assessment & Plan Moderate obstructive sleep apnea Moderate obstructive sleep apnea, AHI 15/hour. We reviewed treatment options including weight loss, oral appliance, CPAP therapy as the gold standard treatment and referral to ENT. Patient was consulted on the effectiveness of CPAP and he was evaluate for cpap treatment but patient reports that he would not be able to tolerate having something on his face.  He has been set up with orthodontics for oral appliance and paid for device. He does not fell he would be able to tolerate CPAP. He would like a permeant treatment for OSA. Discussed Zepbound  for weight loss to reduce AHI, targeting a dose of 10 to 15 mg. Considered combination with a mouthpiece, pending delivery, and advised positional therapy, specifically side sleeping. - Initiate Zepbound  at 2.5 mg once weekly, titrate every 4 hours as tolerates  - Follow-up in four to six weeks post-Zepbound  initiation, in person or virtually, with vital signs available. - Advise on positional therapy, avoiding supine position.  Patient has moderate to severe sleep apnea related to obesity with BMI >/32, posing significant cardiovascular risks. Patient has failed traditional weight loss measures with diet and exercise for >/6 months. Patient will be initiated on Zepbound  (tirzepatide ) for weight management. Zepbound  is the only pharmaceutical treatment approved for moderate-to-severe OSA in adults who are overweight (BMI >/27) or obese (BMI >/30). The patient will continue lifestyle modifications, including structured nutrition and physical activity as directed. No other GLP1 therapy will be used simultaneously at this time. The patient does not have any FDA labeled contraindications to this agent, including pregnancy, lactation, hx or family history of medullary thyroid  cancer, or multiple endocrine neoplasia type II. Side effect profile has been  reviewed with patient. Aware of red flag symptoms to notify of immediately or seek emergency care, including severe nausea/vomiting, inability to pass bowels or gas, severe abdominal pain/tenderness, jaundice.    Obesity (BMI 32) Obesity with BMI of 32. Discussed Zepbound  for weight loss, starting at 2.5 mg and increasing to  10 to 15 mg. Explained side effects: nausea, vomiting, diarrhea, constipation, and strategies to mitigate them. Emphasized hydration, avoiding large fluid intake during meals. Considered insurance coverage and personal experiences with Zepbound . - Initiate Zepbound  at 2.5 mg once weekly, increasing dose as tolerated. - Provide educational handout on managing side effects and dietary recommendations. - Ensure prior authorization for Zepbound  through insurance. - [facility].   Almarie LELON Ferrari, NP 03/10/2024

## 2024-03-11 ENCOUNTER — Telehealth: Payer: Self-pay

## 2024-03-11 ENCOUNTER — Other Ambulatory Visit (HOSPITAL_COMMUNITY): Payer: Self-pay

## 2024-03-11 NOTE — Telephone Encounter (Signed)
 Pt of your's. Please see above

## 2024-03-11 NOTE — Telephone Encounter (Signed)
 He has an oral appliance which he is using for the treatment of his OSA. I spoke with drug rep but there is no stipulation that they need to be on CPAP.  Zepbound  is approved for moderate - severe OSA with BMI >30 regardless of CPAP use. Patient has BCBS for insurance and works for [employer] and he told me that his insurance covers medication for [employer].   Can we re-submit PA and use weight management BMI >30  If denied again can we please set up a 1:1 appeal with a clinician   Lastly if still denied would recommend visit to discuss cpap initiation to get medication covered

## 2024-03-11 NOTE — Telephone Encounter (Signed)
*  Pulm  Pharmacy Patient Advocate Encounter   Received notification from Patient Advice Request messages that prior authorization for Zepbound  2.5MG /0.5ML pen-injectors   is required/requested.   Insurance verification completed.   The patient is insured through [facility] .   Per test claim: PA required; PA started via CoverMyMeds. KEY [id number] . Please see clinical question(s) below that I am not finding the answer to in their chart and advise.

## 2024-03-11 NOTE — Telephone Encounter (Signed)
 Thank you :)

## 2024-03-12 ENCOUNTER — Other Ambulatory Visit (HOSPITAL_COMMUNITY): Payer: Self-pay

## 2024-03-12 NOTE — Telephone Encounter (Signed)
 Pharmacy Patient Advocate Encounter  Received notification from [employer] that Prior Authorization for Zepbound  2.5mg  has been APPROVED from 03/12/2024 to 09/09/2024. Ran test claim, Copay is $24.99. This test claim was processed through [facility]- copay amounts may vary at other pharmacies due to pharmacy/plan contracts, or as the patient moves through the different stages of their insurance plan.  [employer] , the mfr of ZEPBOUND  2.5 MG/0.5 ML paid 20.01 toward your plan copay. $8070.00 out of $1950.00 in benefits remaining

## 2024-03-12 NOTE — Telephone Encounter (Signed)
 Addended note, we have spoke about CPAP at previous office visits. This was specifically to discuss weight loss management. He has oral appliance for OSA treatment. Unable to tolerate CPAP due to mask/claustrophobia

## 2024-03-12 NOTE — Telephone Encounter (Signed)
 Submitted with chart notes added. Pending determination.

## 2024-03-13 NOTE — Telephone Encounter (Signed)
 Pt notified by [facility] Chart message. NFN

## 2024-03-13 NOTE — Telephone Encounter (Signed)
 Amazing thank you for your hard work on this  Clinical staff can you please relay message above to patient, he needs a follow-up with me in 4-6 weeks as we continue to titrate dose. Can be virtual

## 2024-04-06 ENCOUNTER — Other Ambulatory Visit: Payer: Self-pay | Admitting: Primary Care

## 2024-04-07 NOTE — Telephone Encounter (Signed)
 Pt notified that his insurance will pay for refill on 04/13/2024 as he picked up his last fill on 03/15/2024. Insurance wont pay for refill until he shows he has finished initial dosing per pharmacy

## 2024-04-13 MED ORDER — ZEPBOUND 2.5 MG/0.5ML ~~LOC~~ SOAJ: 2.5000 mg | SUBCUTANEOUS | 1 refills | Status: DC

## 2024-04-13 NOTE — Telephone Encounter (Signed)
 Please advise if refill is appropriate

## 2024-04-13 NOTE — Addendum Note (Signed)
 Addended by: HOPE ALMARIE ORN on: 04/13/2024 11:57 AM   Modules accepted: Orders

## 2024-04-13 NOTE — Telephone Encounter (Signed)
 [facility] [address]

## 2024-04-24 ENCOUNTER — Telehealth: Payer: Self-pay | Admitting: Primary Care

## 2024-04-24 ENCOUNTER — Telehealth: Admitting: Primary Care

## 2024-04-24 ENCOUNTER — Other Ambulatory Visit (HOSPITAL_BASED_OUTPATIENT_CLINIC_OR_DEPARTMENT_OTHER): Payer: Self-pay

## 2024-04-24 DIAGNOSIS — Z6830 Body mass index (BMI) 30.0-30.9, adult: Secondary | ICD-10-CM | POA: Diagnosis not present

## 2024-04-24 DIAGNOSIS — G4733 Obstructive sleep apnea (adult) (pediatric): Secondary | ICD-10-CM | POA: Diagnosis not present

## 2024-04-24 DIAGNOSIS — E669 Obesity, unspecified: Secondary | ICD-10-CM | POA: Diagnosis not present

## 2024-04-24 MED ORDER — ZEPBOUND 5 MG/0.5ML ~~LOC~~ SOAJ
5.0000 mg | SUBCUTANEOUS | 1 refills | Status: DC
  Filled 2024-04-24 – 2024-05-06 (×2): qty 2, 28d supply, fill #0

## 2024-04-24 NOTE — Progress Notes (Unsigned)
 Virtual Visit via Video Note  I connected with Jeremy Owens on 04/24/24 at  1:30 PM EDT by a video enabled telemedicine application and verified that I am speaking with the correct person using two identifiers.  Location: Patient: Home Provider: Office   I discussed the limitations of evaluation and management by telemedicine and the availability of in person appointments. The patient expressed understanding and agreed to proceed.  History of Present Illness: 48 year, never smoked. PMH obesity, snoring, PVC, s/p lap colectomy, hx right knee arthoplasty.   Previous [facility] pulmonary encounter: 12/12/2023  Discussed the use of AI scribe software for clinical note transcription with the patient, who gave verbal consent to proceed.  History of Present Illness   Jeremy Owens is a 48 year old male with moderate sleep apnea who presents for a follow-up visit.  He has previously tried various oral appliances for his sleep apnea, which he found to be somewhat effective. However, he is interested in exploring a more formal and lasting solution. His sleep study from April of the previous year indicated moderate sleep apnea with 15.4 apneic events per hour, and the lowest oxygen saturation recorded was 78%, with an average of 93%. He experiences some sleep disruption, though he attributes a recent poor night's sleep to external disturbances rather than his sleep apnea.  He is attempting to lose weight, although he describes the process as challenging. He is engaging in more physical activity, including hiking and regular gym visits. His BMI is over 30, and he acknowledges the potential benefits of weight loss on his sleep apnea.  His wife is on Zepbound  and has lost about 60 pounds with it. He is aware of the potential side effects of weight loss medications, including nausea, vomiting, diarrhea, constipation, and risks related to thyroid  cancer, gallbladder issues, and mental health changes.       03/10/2024 Discussed the use of AI scribe software for clinical note transcription with the patient, who gave verbal consent to proceed.  History of Present Illness Jeremy Owens is a 48 year old male who presents for a consultation regarding Zantbound.  Patient has moderate OSA, previously discussed treatment options including weight loss, oral appliance, CPAP therapy and referral to ENT. He preferred to work on weight loss and has been referred to orthodontics for oral appliance for treatment of OSA. He is in the processes of getting a mouthpiece with Dr. Micky which he has paid for it. He is advised to try side sleeping positions to help with his sleep apnea management.  He is considering starting Zepbound , a medication that requires prior authorization from his insurance. He is aware of potential side effects such as nausea, vomiting, diarrhea, and constipation, and has been informed about dietary modifications to mitigate these effects, including eating smaller, more frequent meals and avoiding certain foods. No personal or family history of diabetes or thyroid  cancer.  04/24/2024- Interim hx  Discussed the use of AI scribe software for clinical note transcription with the patient, who gave verbal consent to proceed.  History of Present Illness Jeremy Owens is a 48 year old male with moderate sleep apnea who presents for follow-up after starting Zepbound .  He has been on Zepbound  for over six weeks and tolerates it well, with the only notable side effect being a decreased appetite. No nausea, vomiting, diarrhea, or constipation. His current weight is 226 pounds, reflecting a weight loss of approximately 15 pounds since his last visit.  He has moderate sleep apnea and uses an oral appliance. His wife has reported that he is no longer snoring or having episodes of gasping during sleep. He is scheduled for another sleep study in January, but this is delayed due to his  orthodontist's upcoming surgery.  His current medications include Zepbound  2.5 mg, administered subcutaneously once a week. He also has a prescription for metoprolol  titrate 25 mg, which he has not taken in two months but wishes to keep on his medication list for potential future use.   Observations/Objective:  Appears well without overt respiratory symptoms  Assessment and Plan:  1. OSA (obstructive sleep apnea) (Primary)  2. Obesity (BMI 30-39.9)  Assessment & Plan Obstructive sleep apnea, moderate Moderate obstructive sleep apnea managed with an oral appliance, effectively reducing symptoms such as snoring and gasping. Concurrent use of Zepbound  for weight loss is anticipated to further improve sleep apnea. A 15-pound weight loss since the last visit may contribute to symptom improvement. Coordination with the orthodontist regarding the timing of the next sleep study is advised, considering weight loss progress and current symptom improvement. Delaying the repeat sleep study until reaching a weight loss goal may be beneficial to assess sleep apnea without the oral appliance. - Titrate Zepbound  to 5 mg subcutaneously once a week for four weeks, then increase to 7.5 mg, and finally to 10 mg if tolerated. - Provide one refill of the current Zepbound  dose to allow for adjustment period. - Discuss with the orthodontist about the timing of the next sleep study, considering weight loss progress and current symptom improvement. - Consider delaying the repeat sleep study until reaching a weight loss goal to assess sleep apnea without the oral appliance.  Obesity Obesity management with Zepbound  has resulted in a 15-pound weight loss. He is tolerating the medication well, with reduced appetite as a known side effect. Continued weight loss is expected to further improve sleep apnea and overall health. - Continue Zepbound  with planned titration to higher doses as tolerated. - Monitor weight loss  progress and adjust treatment plan as necessary.   Follow Up Instructions:   3 months with Beth NP or sooner if needed  I discussed the assessment and treatment plan with the patient. The patient was provided an opportunity to ask questions and all were answered. The patient agreed with the plan and demonstrated an understanding of the instructions.   The patient was advised to call back or seek an in-person evaluation if the symptoms worsen or if the condition fails to improve as anticipated.  I provided 25 minutes of non-face-to-face time during this encounter.   Almarie LELON Ferrari, NP

## 2024-04-24 NOTE — Telephone Encounter (Signed)
 Needs FU in 3 months with Beth NP for OSA/weight loss management

## 2024-05-01 NOTE — Patient Instructions (Signed)
  VISIT SUMMARY: Today, you came in for a follow-up visit to discuss your moderate sleep apnea and the progress you've made since starting Zepbound . You've been on Zepbound  for over six weeks and have experienced a 15-pound weight loss, with the only side effect being a decreased appetite. Your sleep apnea symptoms have improved, and you are scheduled for another sleep study, although it may be delayed due to your orthodontist's surgery.  YOUR PLAN: -OBSTRUCTIVE SLEEP APNEA, MODERATE: Moderate obstructive sleep apnea is a condition where your airway becomes partially blocked during sleep, causing snoring and gasping. Your symptoms have improved with the use of an oral appliance and weight loss from Zepbound . We will increase your Zepbound  dose gradually to 5 mg per week for four weeks, then to 7.5 mg, and finally to 10 mg if you tolerate it well. We will also coordinate with your orthodontist about the timing of your next sleep study, which may be delayed until you reach your weight loss goal to assess your sleep apnea without the oral appliance.  -OBESITY: Obesity is a condition where excess body fat affects your health. You have lost 15 pounds since starting Zepbound , which is helping to improve your sleep apnea and overall health. We will continue with the planned increase in your Zepbound  dose as you tolerate it and monitor your weight loss progress to adjust the treatment plan as needed.  INSTRUCTIONS: We will increase your Zepbound  dose to 5 mg subcutaneously once a week for four weeks, then to 7.5 mg, and finally to 10 mg if tolerated. Please discuss with your orthodontist about the timing of your next sleep study, considering your weight loss progress and current symptom improvement. It may be beneficial to delay the repeat sleep study until you reach your weight loss goal to assess your sleep apnea without the oral appliance.                      Contains text generated by  [employer]bridge.                                 Contains text generated by [employer]bridge.

## 2024-05-06 ENCOUNTER — Other Ambulatory Visit (HOSPITAL_BASED_OUTPATIENT_CLINIC_OR_DEPARTMENT_OTHER): Payer: Self-pay

## 2024-06-05 MED ORDER — ZEPBOUND 7.5 MG/0.5ML ~~LOC~~ SOAJ: 7.5000 mg | SUBCUTANEOUS | 1 refills | Status: DC

## 2024-06-05 NOTE — Telephone Encounter (Signed)
 I will increase dose to 7.5mg  [place] weekly Lets follow-up in January virtual to see how he is doing, please schedule on Friday afternoon with me

## 2024-07-23 ENCOUNTER — Ambulatory Visit: Admitting: Cardiology

## 2024-07-23 NOTE — Progress Notes (Unsigned)
 " Cardiology Office Note:  .   Date:  07/23/2024  ID:  Jeremy Owens, DOB 23-Jan-1976, MRN [id number] PCP: Wendee Lynwood HERO, NP  [facility] Providers Cardiologist:  Alm Clay, MD { Click to update primary MD,subspecialty MD or APP then REFRESH:1}    No chief complaint on file.   Patient Profile: .     Jeremy Owens is a obese 49 y.o. male  with a PMH notable for OSA (using oral appliance) and palpitations who presents here for ~ annual follow-up evaluation for Palpitations/ PVCs the request of Wendee Lynwood HERO, NP.  He was referred to Cardiology following an [facility] Admission with chest pain/ palpitations.   Woke up around 3 AM feeling hot and flushed with some palpitations in his heart.  Had a cough.  Noted that he had a catch his breath.  He was able to fall back asleep again and had another episode a few hours later.  Denied any overt chest pain or dyspnea, leg swelling.  No PND orthopnea.  Drinks 3 beers daily.  No history of withdrawal.  Does also drink coffee but had not had any at night. =>  Noted to be hypertensive at 106/104 with heart rate of 125 bpm.   EKG shows sinus tachycardia with rate of 110 with occasional PVCs. Troponin, D-dimer normal.  BMP normal.  TSH normal.  Given a dose of metoprolol . Referral to cardiology   Jeremy Owens was last seen on July 11, 2023 as an 47-month follow-up.  He was doing relatively well with no health issues.  Palpitation pretty well-controlled with minimal episodes.  Engaging in relatively vigorous exercise activities including hiking and finding steps etc.  He had been on metoprolol  but ran out of the prescription and to be doing well without metoprolol .  We decided to simply use as needed metoprolol .  Blood pressure is borderline, and the plan was to monitor.  He was started on Zepbound  in September for obesity with OSA.  As of April 24, 2024, he had lost 15 pounds..    Subjective  Discussed the use of AI scribe software for  clinical note transcription with the patient, who gave verbal consent to proceed. 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  TSH 1.17.  Hgb 16.2, PLT 219.  A1c 5.6 Results    Risk Assessment/Calculations:   {Does this patient have ATRIAL FIBRILLATION?[phone]} No BP recorded.  {Refresh Note OR Click here to enter BP  :1}***         Physical Exam:   VS:  There were no vitals taken for this visit.   Wt Readings from Last 3 Encounters:  03/10/24 241 lb 6.4 oz (109.5 kg)  12/17/23 240 lb (108.9 kg)  12/12/23 241 lb (109.3 kg)    Physical Exam    GEN: Well nourished, well developed in no acute distress; *** NECK: No JVD; No carotid bruits CARDIAC: Normal S1, S2; RRR, no murmurs, rubs, gallops RESPIRATORY:  Clear to auscultation without rales, wheezing or rhonchi ; nonlabored, good air movement. ABDOMEN: Soft, non-tender, non-distended EXTREMITIES:  No  edema; No deformity      ASSESSMENT AND PLAN: .   No problem-specific Assessment & Plan notes found for this encounter.   No orders of the defined types were placed in this encounter.   Assessment and Plan Assessment & Plan        {Are you ordering a CV Procedure (e.g. stress test, cath, DCCV, TEE, etc)?   Press F2        :[id number]}   Follow-Up: No follow-ups on file.  I spent *** minutes in the care of ULIS KAPS today including {CHL AMB CAR Time Based Billing Options STW (Optional):[phone]::documenting in the encounter.}   Portions of this note were dictated using DRAGON voice recognition software. Please disregard any errors in  transcription. This record has been created using Conservation officer, historic buildings. Errors have been sought and corrected, but may not always be located. Such creation errors do not reflect on the standard of medical care.    Signed, Alm MICAEL Clay, MD, MS Alm Clay, M.D., M.S. Interventional Cardiologist  [facility] Pager # [phone]      "

## 2024-07-27 ENCOUNTER — Ambulatory Visit: Admitting: Primary Care

## 2024-07-29 ENCOUNTER — Encounter: Payer: Self-pay | Admitting: Medical

## 2024-07-29 ENCOUNTER — Other Ambulatory Visit: Payer: Self-pay

## 2024-07-29 ENCOUNTER — Ambulatory Visit: Payer: Self-pay | Admitting: Medical

## 2024-07-29 VITALS — BP 134/100 | HR 94 | Temp 97.3°F | Ht 72.0 in

## 2024-07-29 DIAGNOSIS — M25521 Pain in right elbow: Secondary | ICD-10-CM

## 2024-07-29 DIAGNOSIS — M7711 Lateral epicondylitis, right elbow: Secondary | ICD-10-CM

## 2024-07-29 NOTE — Progress Notes (Signed)
 [facility] [address] [place], [place] [address]   Office Visit Note  Patient Name: Jeremy Owens Date of Birth [id number]  Medical Record number [id number]  Date of Service: 07/29/2024  Chief Complaint  Patient presents with   Acute Visit    R elbow pain;overusing from working at home. He felt a tearing/burning sensation when lifting the lid on the garbage can after working outside on the barn all day. He has tried Tylenol , Excedrin, and ibuprofen  with no improvement.     HPI Discussed the use of AI scribe software for clinical note transcription with the patient, who gave verbal consent to proceed.  History of Present Illness Jeremy Owens Masoud Nyce is a 49 year old right-handed male who presents with right elbow pain following an injury while lifting a trash can lid.  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 - 2-3 weeks ago, after lifting a trash can lid with right arm, experienced severe (8/10) burning pain radiating from posterolateral right elbow to proximal aspect of lateral right forearm; Pain gradually improved over next day or so but has not gone away. - Pain worsened by gripping or lifting objects with right hand/arm - Pain intensity currently 6/10 with movement  - Pain can wake him at night if he moves arm in certain way - No swelling, bruising, numbness, tingling, or pain elsewhere to right elbow - Pain localized to posterolateral elbow region and radiates distally - Able to perform usual office tasks, i.e. using computer mouse and writing without difficulty; Experiences pain when simultaneously gripping with right hand and flexing or extending at right elbow.   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 Expects this will take place in next couple months.  Prior treatments for elbow pain - Has worn tennis elbow strap and compressive elbow brace. Has taken ibuprofen  (up to 800 mg at a time), Tylenol  and Excedrin, applied topical Voltaren, and alternating compress of ice and heat - Only temporary or minimal relief from these interventions  Relevant musculoskeletal history - Previous right ulnar nerve transposition surgery in 2018 (Dr. Prentice Pagan, [facility]) and right ulnar nerve decompression in 1996 related to prior baseball injury. Hx of lateral epicondylitis to left elbow. 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 - Right-handed    Current Medication:  Outpatient Encounter Medications as of 07/29/2024  Medication Sig   tirzepatide  (ZEPBOUND ) 7.5 MG/0.5ML Pen Inject 7.5 mg into the skin once a week.   No facility-administered encounter medications on file as of 07/29/2024.      Medical History: Past Medical History:  Diagnosis Date   COVID-19 2022   Diverticulitis    Family history of polyps in the colon    History of kidney stones    History of nephrolithiasis      Vital Signs: BP (!) 134/100   Pulse 94   Temp (!) 97.3 F (36.3 C)   Ht 6' (1.829 m)   SpO2 99%   BMI  32.74 kg/m    Review of Systems  Constitutional:  Positive for activity change (limited by right elbow pain).  Musculoskeletal:  Positive for arthralgias (right elbow). Negative for joint swelling, neck pain and neck stiffness.  Neurological:  Negative for weakness and numbness.    Physical Exam Vitals reviewed.  Constitutional:      General: He is not in acute distress.     Appearance: He is not ill-appearing.  Musculoskeletal:     Right elbow: No swelling, deformity or effusion. Normal range of motion. Tenderness (from lateral epicondyle to proximal extensor muscles in forearm) present in lateral epicondyle (moderate). No radial head, medial epicondyle or olecranon process tenderness.     Left elbow: No swelling, deformity or effusion. Normal range of motion.     Comments: No other bony tenderness. Pain reproduced with active supination of right forearm and right wrist extension against resistance. Intermittent crepitus at lateral right elbow. Pain also reproduced with forceful gripping in right hand. Strength 5/5 and symmetric to bilateral UE. Sensation grossly intact to right hand/wrist/forearm.   Skin:    Capillary Refill: Capillary refill takes less than 2 seconds.  Neurological:     Mental Status: He is alert.     Assessment/Plan: 1. Right elbow pain (Primary) 2. Lateral epicondylitis of right elbow  - Ambulatory referral to Orthopedic Surgery   Given patient has had poor response to conservative treatments and given potential effect on his work, advised consultation with orthopedics for possible imaging and intervention (i.e. steroid injection). Since patient has seen provider at [facility] (Dr. Prentice Pagan for right ulnar nerve transposition) in past, will make referral there.  In meantime, encouraged to rest/avoid activities that reproduce/worsen pain, including heavy lifting with right arm. May continue wearing soft compressive elbow brace and tennis elbow strap. May continue alternating ice and heat. May continue over-the-counter Ibuprofen  or Tylenol  when needed for pain.     General Counseling: tareq dwan understanding of the findings of todays visit. he has been encouraged to call the office with any questions or concerns that should arise related to todays visit.    Time spent:20 Minutes    Joen Arts PA-C Physician  Assistant

## 2024-07-29 NOTE — Patient Instructions (Addendum)
-  We will make a referral for you to Dr. Prentice Pagan at [facility]. If you have not received a call to schedule an appointment by Monday, 08/03/24, consider calling their office directly at [phone]. -Continue to rest/avoid activities that reproduce/worsen pain, including heavy lifting with right arm.  -Continue wearing soft compressive elbow brace and tennis elbow strap when active at work or home.  -You may continue alternating ice and heat for comfort.  -You may continue over-the-counter Ibuprofen  or Tylenol  when needed for pain.

## 2024-07-30 ENCOUNTER — Ambulatory Visit: Payer: Self-pay | Admitting: Adult Health

## 2024-07-31 ENCOUNTER — Telehealth: Payer: Self-pay

## 2024-07-31 MED ORDER — ZEPBOUND 10 MG/0.5ML ~~LOC~~ SOAJ: 10.0000 mg | SUBCUTANEOUS | 1 refills | Status: AC

## 2024-07-31 NOTE — Telephone Encounter (Signed)
 Please advise mychart messages.

## 2024-07-31 NOTE — Telephone Encounter (Signed)
 I will send in Zepbound  10mg  [place] weekly with 1 refill. Maintenance dose is between 10-15mg . If needed we can continue to increase dose but we do not have to after this  Set up a check in with me virtually in the next 4 weeks please

## 2024-08-20 ENCOUNTER — Ambulatory Visit: Admitting: Cardiology

## 2024-08-21 ENCOUNTER — Ambulatory Visit: Admitting: Orthopedic Surgery

## 2024-08-25 ENCOUNTER — Ambulatory Visit: Admitting: Primary Care
